# Patient Record
Sex: Male | Born: 1960 | Race: White | Hispanic: No | Marital: Married | Smoking: Former smoker
Health system: Southern US, Community
[De-identification: ages and names within clinical notes are randomized; demographics above are authoritative.]

## PROBLEM LIST (undated history)

## (undated) DIAGNOSIS — Z8739 Personal history of other diseases of the musculoskeletal system and connective tissue: Secondary | ICD-10-CM

## (undated) DIAGNOSIS — I495 Sick sinus syndrome: Secondary | ICD-10-CM

## (undated) DIAGNOSIS — M1990 Unspecified osteoarthritis, unspecified site: Secondary | ICD-10-CM

## (undated) DIAGNOSIS — Z950 Presence of cardiac pacemaker: Secondary | ICD-10-CM

## (undated) DIAGNOSIS — R112 Nausea with vomiting, unspecified: Secondary | ICD-10-CM

## (undated) DIAGNOSIS — K219 Gastro-esophageal reflux disease without esophagitis: Secondary | ICD-10-CM

## (undated) DIAGNOSIS — I209 Angina pectoris, unspecified: Secondary | ICD-10-CM

## (undated) DIAGNOSIS — R55 Syncope and collapse: Secondary | ICD-10-CM

## (undated) DIAGNOSIS — I10 Essential (primary) hypertension: Secondary | ICD-10-CM

## (undated) DIAGNOSIS — R519 Headache, unspecified: Secondary | ICD-10-CM

## (undated) DIAGNOSIS — R531 Weakness: Secondary | ICD-10-CM

## (undated) DIAGNOSIS — R0600 Dyspnea, unspecified: Secondary | ICD-10-CM

## (undated) DIAGNOSIS — Z8719 Personal history of other diseases of the digestive system: Secondary | ICD-10-CM

## (undated) DIAGNOSIS — F32A Depression, unspecified: Secondary | ICD-10-CM

## (undated) DIAGNOSIS — J449 Chronic obstructive pulmonary disease, unspecified: Secondary | ICD-10-CM

## (undated) DIAGNOSIS — M542 Cervicalgia: Secondary | ICD-10-CM

## (undated) DIAGNOSIS — R51 Headache: Secondary | ICD-10-CM

## (undated) DIAGNOSIS — Z9889 Other specified postprocedural states: Secondary | ICD-10-CM

## (undated) DIAGNOSIS — R0602 Shortness of breath: Secondary | ICD-10-CM

## (undated) DIAGNOSIS — Z8619 Personal history of other infectious and parasitic diseases: Secondary | ICD-10-CM

## (undated) DIAGNOSIS — F329 Major depressive disorder, single episode, unspecified: Secondary | ICD-10-CM

## (undated) DIAGNOSIS — R351 Nocturia: Secondary | ICD-10-CM

## (undated) HISTORY — PX: INSERT / REPLACE / REMOVE PACEMAKER: SUR710

## (undated) HISTORY — PX: CARDIAC ELECTROPHYSIOLOGY STUDY & DFT: SHX1293

## (undated) HISTORY — DX: Syncope and collapse: R55

## (undated) HISTORY — PX: BACK SURGERY: SHX140

## (undated) HISTORY — DX: Sick sinus syndrome: I49.5

## (undated) HISTORY — PX: HERNIA REPAIR: SHX51

## (undated) HISTORY — PX: CARDIAC CATHETERIZATION: SHX172

## (undated) HISTORY — PX: SHOULDER ARTHROSCOPY W/ ROTATOR CUFF REPAIR: SHX2400

## (undated) HISTORY — PX: UMBILICAL HERNIA REPAIR: SHX196

## (undated) HISTORY — PX: CARDIAC ELECTROPHYSIOLOGY MAPPING AND ABLATION: SHX1292

---

## 1999-07-21 HISTORY — PX: LOOP RECORDER INSERTION: SHX6722

## 1999-08-06 ENCOUNTER — Inpatient Hospital Stay (HOSPITAL_COMMUNITY): Admission: EM | Admit: 1999-08-06 | Discharge: 1999-08-11 | Payer: Self-pay | Admitting: Emergency Medicine

## 1999-08-06 ENCOUNTER — Encounter: Payer: Self-pay | Admitting: Emergency Medicine

## 1999-08-08 ENCOUNTER — Encounter: Payer: Self-pay | Admitting: Internal Medicine

## 1999-08-11 ENCOUNTER — Encounter: Payer: Self-pay | Admitting: Internal Medicine

## 2000-07-12 ENCOUNTER — Ambulatory Visit (HOSPITAL_COMMUNITY): Admission: RE | Admit: 2000-07-12 | Discharge: 2000-07-12 | Payer: Self-pay | Admitting: Gastroenterology

## 2001-03-28 ENCOUNTER — Encounter: Payer: Self-pay | Admitting: Internal Medicine

## 2001-05-05 ENCOUNTER — Emergency Department (HOSPITAL_COMMUNITY): Admission: EM | Admit: 2001-05-05 | Discharge: 2001-05-05 | Payer: Self-pay | Admitting: *Deleted

## 2001-05-07 ENCOUNTER — Encounter: Payer: Self-pay | Admitting: *Deleted

## 2001-05-07 ENCOUNTER — Emergency Department (HOSPITAL_COMMUNITY): Admission: EM | Admit: 2001-05-07 | Discharge: 2001-05-07 | Payer: Self-pay

## 2001-07-15 ENCOUNTER — Encounter: Payer: Self-pay | Admitting: Internal Medicine

## 2001-07-20 HISTORY — PX: LOOP RECORDER REMOVAL: SHX6723

## 2001-07-25 ENCOUNTER — Encounter: Payer: Self-pay | Admitting: Internal Medicine

## 2001-07-25 ENCOUNTER — Ambulatory Visit (HOSPITAL_COMMUNITY): Admission: RE | Admit: 2001-07-25 | Discharge: 2001-07-25 | Payer: Self-pay | Admitting: Internal Medicine

## 2003-08-02 ENCOUNTER — Encounter: Payer: Self-pay | Admitting: Cardiology

## 2003-08-02 ENCOUNTER — Inpatient Hospital Stay (HOSPITAL_COMMUNITY): Admission: EM | Admit: 2003-08-02 | Discharge: 2003-08-06 | Payer: Self-pay | Admitting: Emergency Medicine

## 2003-08-16 ENCOUNTER — Encounter: Admission: RE | Admit: 2003-08-16 | Discharge: 2003-08-16 | Payer: Self-pay | Admitting: Family Medicine

## 2005-06-08 ENCOUNTER — Ambulatory Visit: Payer: Self-pay | Admitting: Cardiology

## 2005-06-08 ENCOUNTER — Emergency Department (HOSPITAL_COMMUNITY): Admission: EM | Admit: 2005-06-08 | Discharge: 2005-06-08 | Payer: Self-pay | Admitting: Emergency Medicine

## 2007-11-17 ENCOUNTER — Encounter: Payer: Self-pay | Admitting: Cardiology

## 2008-10-02 ENCOUNTER — Encounter: Payer: Self-pay | Admitting: Cardiology

## 2008-10-03 ENCOUNTER — Encounter: Payer: Self-pay | Admitting: Cardiology

## 2008-10-05 ENCOUNTER — Encounter: Payer: Self-pay | Admitting: Cardiology

## 2008-12-14 ENCOUNTER — Encounter: Payer: Self-pay | Admitting: Cardiology

## 2008-12-17 ENCOUNTER — Encounter: Payer: Self-pay | Admitting: Cardiology

## 2009-01-11 ENCOUNTER — Encounter: Payer: Self-pay | Admitting: Cardiology

## 2009-01-12 ENCOUNTER — Encounter (INDEPENDENT_AMBULATORY_CARE_PROVIDER_SITE_OTHER): Payer: Self-pay | Admitting: *Deleted

## 2009-01-12 ENCOUNTER — Ambulatory Visit: Payer: Self-pay | Admitting: Cardiology

## 2009-01-12 DIAGNOSIS — R55 Syncope and collapse: Secondary | ICD-10-CM | POA: Insufficient documentation

## 2009-01-19 ENCOUNTER — Telehealth: Payer: Self-pay | Admitting: Cardiology

## 2009-01-20 ENCOUNTER — Ambulatory Visit: Payer: Self-pay | Admitting: Internal Medicine

## 2009-01-20 DIAGNOSIS — R079 Chest pain, unspecified: Secondary | ICD-10-CM | POA: Insufficient documentation

## 2009-01-28 ENCOUNTER — Ambulatory Visit: Payer: Self-pay | Admitting: Cardiology

## 2009-01-28 ENCOUNTER — Encounter: Payer: Self-pay | Admitting: Internal Medicine

## 2009-03-07 ENCOUNTER — Ambulatory Visit: Payer: Self-pay | Admitting: Cardiology

## 2009-03-30 ENCOUNTER — Encounter: Payer: Self-pay | Admitting: Cardiology

## 2009-04-06 ENCOUNTER — Encounter (INDEPENDENT_AMBULATORY_CARE_PROVIDER_SITE_OTHER): Payer: Self-pay | Admitting: *Deleted

## 2009-04-06 ENCOUNTER — Ambulatory Visit: Payer: Self-pay | Admitting: Cardiology

## 2009-04-06 DIAGNOSIS — R9430 Abnormal result of cardiovascular function study, unspecified: Secondary | ICD-10-CM | POA: Insufficient documentation

## 2009-05-20 ENCOUNTER — Encounter (INDEPENDENT_AMBULATORY_CARE_PROVIDER_SITE_OTHER): Payer: Self-pay | Admitting: *Deleted

## 2009-05-26 ENCOUNTER — Encounter: Payer: Self-pay | Admitting: Cardiology

## 2009-08-05 ENCOUNTER — Emergency Department (HOSPITAL_COMMUNITY): Admission: EM | Admit: 2009-08-05 | Discharge: 2009-08-05 | Payer: Self-pay | Admitting: Emergency Medicine

## 2010-03-23 NOTE — Letter (Signed)
Summary: Generic Engineer, agricultural at [facility] [address]   [place], [place] [address]   Phone: [phone]  Fax: [phone]        May 20, 2009 MRN: [id number]    [facility] [address] [place], [place]  [address]    Dear Mr. MARRO,   We ordered lab work to check your thyroid hormone level following your January 17th office visit. We also mailed you a letter on February 16th reminding you to have this lab work done. However, it does not appear this lab work has been done. We do have recent labs from your primary MD, but the thyroid level was not a part of this lab work.  Please, take the enclosed order to the [facility] or you primary MD at your earliest convenience to have this lab work done. If you go to your primary MD, please make sure they fax this report to [place] at [phone]. If you do not plan to have the lab work completed, please notify me so that we can properly document this in your chart.       Sincerely,  Cyril Loosen, RN, BSN  This letter has been electronically signed by your physician.  Appended Document: Generic Letter This letter will be mailed certified to pt.

## 2010-03-23 NOTE — Assessment & Plan Note (Signed)
Summary: 6 WK FU PER [facility] IN [place]   Visit Type:  Follow-up Referring Provider:  Dalsimer Primary Provider:  None  CC:  follow-up visit.  History of Present Illness: the patient is a 50 year old male with a long-standing history of syncope. In 2003 he underwent tilt table testing. His symptoms are consistent with vasovagal syncope. He reports nausea dizziness tunnelvision and severe diaphoresis prior to an episode. He was recently seen by Dr. Ladona Ridgel who started him on Florinef 0.1 mg p.o. b.i.d. the patient has had no syncope in the interim, although he states on one occasion he felt presyncopal. The patient was not aware of cross counter maneuvers. I discussed this with him today. He denies any central chest pain, shortness of breath orthopnea or PND. The patient will need  shoulder surgery, and was previously cleared by Dr. Ladona Ridgel. He also underwent a Cardiolite study which showed normal ejection fraction and no definite ischemia, and there was a small defect but this was burred only fixed. 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 He states it is quiteheat intolerant,and is very diaphoretic when exposed to extreme temperatures.  Clinical Review Panels:  CXR CXR results            PORTABLE CHEST - 1 VIEW 10/02/2008 1637 hours:                             Comparison: Portable chest x-ray 11/17/2007.                   Findings: Heart size normal and mediastinal contours unremarkable         for the AP portable technique.  Pulmonary parenchyma clear.         Bronchovascular markings normal.  Pulmonary vascularity normal.  No         pneumothorax.  No visible pleural effusions.                   IMPRESSION:         No acute cardiopulmonary disease.                              ** REPORT SIGNED IN OTHER VENDOR SYSTEM  **                        Reported By: Arnell Sieving, MD            (10/02/2008)    Preventive Screening-Counseling & Management  Alcohol-Tobacco     Smoking Status: never  Comments: Smoked marijuana in the past  Current Medications (verified): 1)  Fludrocortisone Acetate 0.1 Mg Tabs (Fludrocortisone Acetate) .... One By Mouth Two Times A Day At 7:00am and 2:00pm  Allergies: No Known Drug Allergies  Comments:  Nurse/Medical Assistant: The patient's medications were reviewed with the patient and were updated in the Medication List. Pt brought medication bottle to office visit. Cyril Loosen, RN, BSN (March 07, 2009 2:19 PM)  Past History:  Past Medical History: Last updated: 01/12/2009 Tachybrady syndrome syncopal episode hernia repair Substance abuse, marijuana  Past Surgical History: Last updated: 01/11/2009 1. Electrophysiologic study. 2. Implantation of an implantable cardiac loop. 3. Hernia repair  Head up tilt table testing. August 04, 2003  Family History: Last updated: 01/12/2009 Negative FH of Diabetes, Hypertension,  or Coronary Artery Disease  Social History: Last updated: 01/11/2009 Occasional marijuana according to ER notes [facility] Occasional alcohol No tobacco use Full Time Married   Risk Factors: Smoking Status: never (03/07/2009)  Review of Systems       The patient complains of fainting and dizziness.    Vital Signs:  Patient profile:   50 year old male Height:      69 inches Weight:      212.50 pounds Pulse rate:   58 / minute BP sitting:   126 / 80  (left arm) Cuff size:   regular  Vitals Entered By: Cyril Loosen, RN, BSN (March 07, 2009 2:17 PM) CC: follow-up visit   Physical Exam  Additional Exam:  General: Well-developed, well-nourished in no distress head: Normocephalic and atraumatic eyes PERRLA/EOMI intact, conjunctiva and lids normal nose: No deformity or lesions mouth normal dentition, normal posterior pharynx neck: Supple, no JVD.  No masses, thyromegaly or abnormal cervical  nodes. No carotid bruits lungs: Normal breath sounds bilaterally without wheezing.  Normal percussion heart: regular rate and rhythm with normal S1 and S2, no S3 or S4.  PMI is normal.  No pathological murmurs abdomen: Normal bowel sounds, abdomen is soft and nontender without masses, organomegaly or hernias noted.  No hepatosplenomegaly musculoskeletal: Back normal, normal gait muscle strength and tone normal pulsus: Pulse is normal in all 4 extremities Extremities: No peripheral pitting edema neurologic: Alert and oriented x 3 skin: Intact without lesions or rashes cervical nodes: No significant adenopathy psychologic: Normal affect    Impression & Recommendations:  Problem # 1:  VASOVAGAL SYNCOPE (ICD-780.2) the patient is on low-dose propranolol as well as Florinef. Strongly recommended that he drinks Gatorade G2 at least 3-4 20 ounce bottles a day. I also discussed with him to use cross counter maneuvers, to either abort or delay a vasovagal episode. In particularly I told him to cross his legsr and tensing up his legs, abdominal muscles and buttocks. In the future Florinef can be increased or we could consider adding Midrin to his medical regimen.we will check a TSH to make sure the patient does not have a cold thyroid disease explaining his heat intolerance. The following medications were removed from the medication list:    Propranolol Hcl 20 Mg Tabs (Propranolol hcl) .Marland Kitchen... Take one tablet by mouth two times a day  Orders: T-TSH ([id number])  Problem # 2:  CHEST PAIN UNSPECIFIED (ICD-786.50) the patient has atypical chest pain. There is no definite evidence of significant coronary artery disease. He has a low risk Cardiolite study and should be able to undergo surgery without any significant cardiovascular contraindications. The following medications were removed from the medication list:    Propranolol Hcl 20 Mg Tabs (Propranolol hcl) .Marland Kitchen... Take one tablet by mouth two times a  day  Problem # 3:  PREOPERATIVE EXAMINATION (ICD-V72.84) as outlined above the patient has been cleared from a cardiovascular perspective to proceed with surgery.  Patient Instructions: 1)  Labs:  TSH  2)  G2 - 24 oz bottles, 4 bottles per day 3)  Surgical clearance okay.   4)  Follow up 2/16 at 1:00.

## 2010-03-23 NOTE — Assessment & Plan Note (Signed)
Summary: 1 mo fu -srs   Visit Type:  Follow-up Referring Provider:  Dalsimer Primary Provider:  None  CC:  follow-up visit.  History of Present Illness: the patient is a 50 year old male with a history of vasovagal syncope. The patient underwent recent rotator cuff surgery. He has been doing well. He reports no cardiovascular complications. The patient passed an extensive workup for syncope. I placed facial fludrocortisone and encourage his fluid intake. He has had no further episodes of syncope. The patient states he is much improved. Prior to surgery had a stress test done which showed a small scar at the base of the inferior wall with peri-infarct ischemia. However the patient has no chest pain and his ejection fraction 55%. He denies any PND or shortness of breath on exertion  Clinical Review Panels:  CXR CXR results            PORTABLE CHEST - 1 VIEW 10/02/2008 1637 hours:                             Comparison: Portable chest x-ray 11/17/2007.                   Findings: Heart size normal and mediastinal contours unremarkable         for the AP portable technique.  Pulmonary parenchyma clear.         Bronchovascular markings normal.  Pulmonary vascularity normal.  No         pneumothorax.  No visible pleural effusions.                   IMPRESSION:         No acute cardiopulmonary disease.                              ** REPORT SIGNED IN OTHER VENDOR SYSTEM  **                        Reported By: Arnell Sieving, MD           (10/02/2008)  Echocardiogram Echocardiogram  Transthoracic Echocardiogram   ---------------------------------------------------------------   SUMMARY   -  Overall left ventricular systolic function was normal. Left         ventricular ejection fraction was estimated , range being 55         % to 65 %.. This study was inadequate for the evaluation of         left ventricular regional wall motion.   IMPRESSIONS   -  Extremely limited due to poor  sound wave transmission; LV         function appears to be preserved; focal wall motion         abnormality cannot be excluded; suggest TEE or MUGA if         clinically indicated.    Olga Millers M.D.    (08/02/2003)  Nuclear Stress Testing Nuclear Stress Test Findings mild decrease in uptake at the base of her wall. Mild reversibility. Mild hypokinesis at the basal inferior wall. The spinous are consistent mild scar at the base of the inferior wall with mild peri-infarct ischemia. Ejection fraction 55%. (01/28/2009)    Preventive Screening-Counseling & Management  Alcohol-Tobacco     Smoking Status: quit     Year Quit: 1990  Current Problems (verified): 1)  Vasovagal  Syncope  (ICD-780.2) 2)  Preoperative Examination  (ICD-V72.84) 3)  Chest Pain Unspecified  (ICD-786.50) 4)  Syncope  (ICD-780.2)  Current Medications (verified): 1)  Fludrocortisone Acetate 0.1 Mg Tabs (Fludrocortisone Acetate) .... One By Mouth Two Times A Day At 7:00am and 2:00pm  Allergies (verified): No Known Drug Allergies  Comments:  Nurse/Medical Assistant: The patient's medications and allergies were reviewed with the patient and were updated in the Medication and Allergy Lists. List brought.  Past History:  Past Medical History: Last updated: 04/06/2009 Tachybrady syndrome Vasovagal syncope syncopal episode hernia repair Substance abuse, marijuana  Past Surgical History: Last updated: 01/11/2009 1. Electrophysiologic study. 2. Implantation of an implantable cardiac loop. 3. Hernia repair  Head up tilt table testing. August 04, 2003  Family History: Last updated: 01/12/2009 Negative FH of Diabetes, Hypertension, or Coronary Artery Disease  Social History: Last updated: 01/11/2009 Occasional marijuana according to ER notes [facility] Occasional alcohol No tobacco use Full Time Married   Risk Factors: Smoking Status: quit (04/06/2009)  Social History: Smoking Status:  quit  Review  of Systems  The patient denies fatigue, malaise, fever, weight gain/loss, vision loss, decreased hearing, hoarseness, chest pain, palpitations, shortness of breath, prolonged cough, wheezing, sleep apnea, coughing up blood, abdominal pain, blood in stool, nausea, vomiting, diarrhea, heartburn, incontinence, blood in urine, muscle weakness, joint pain, leg swelling, rash, skin lesions, headache, fainting, dizziness, depression, anxiety, enlarged lymph nodes, easy bruising or bleeding, and environmental allergies.    Vital Signs:  Patient profile:   50 year old male Height:      69 inches Weight:      211 pounds Pulse rate:   80 / minute BP sitting:   130 / 80  (left arm) Cuff size:   large  Vitals Entered By: Carlye Grippe (April 06, 2009 12:54 PM) CC: follow-up visit   Physical Exam  Additional Exam:  General: Well-developed, well-nourished in no distress head: Normocephalic and atraumatic eyes PERRLA/EOMI intact, conjunctiva and lids normal nose: No deformity or lesions mouth normal dentition, normal posterior pharynx neck: Supple, no JVD.  No masses, thyromegaly or abnormal cervical nodes lungs: Normal breath sounds bilaterally without wheezing.  Normal percussion heart: regular rate and rhythm with normal S1 and S2, no S3 or S4.  PMI is normal.  No pathological murmurs abdomen: Normal bowel sounds, abdomen is soft and nontender without masses, organomegaly or hernias noted.  No hepatosplenomegaly musculoskeletal: Back normal, normal gait muscle strength and tone normal pulsus: Pulse is normal in all 4 extremities Extremities: No peripheral pitting edema neurologic: Alert and oriented x 3 skin: Intact without lesions or rashes cervical nodes: No significant adenopathy psychologic: Normal affect    Impression & Recommendations:  Problem # 1:  SYNCOPE (ICD-780.2) the patient has a. history of vasovagal syncope. He has much improved with fludrocortisone and increased salt  intake.  Problem # 2:  NONSPECIFIC ABNORMAL UNSPEC CV FUNCTION STUDY (ICD-794.30) the patient has a low risk Cardiolite study. Given the absence of symptoms I do not think a further workup recatheterization is required.I recommended for the patient take an aspirin a day. He also will need blood work to follow his cholesterol levels which can be done by his primary care physician.  Patient Instructions: 1)  Your physician recommends that you continue on your current medications as directed. Please refer to the Current Medication list given to you today. 2)  Your physician wants you to follow-up in: 6 months. You will receive a reminder letter in the  mail about two months in advance. If you don't receive a letter, please call our office to schedule the follow-up appointment.

## 2010-03-23 NOTE — Letter (Signed)
Summary: Certified Letter Response Card  Certified Letter Response Card   Imported By: Cyril Loosen, RN, BSN 06/06/2009 13:24:02  _____________________________________________________________________  External Attachment:    Type:   Image     Comment:   External Document

## 2010-03-23 NOTE — Letter (Signed)
Summary: Generic Engineer, agricultural at [facility] [address]   [place], [place] [address]   Phone: [phone]  Fax: [phone]        April 06, 2009 MRN: [id number]    [facility] [address] [place], [place]  [address]    Dear [name] [name],  We ordered lab work to check your thyroid hormone level following your January 17th office visit. It does not appear this lab work has been done. We do have recent labs from your primary MD but the thyroid level was not a part of this lab work.  Please, take the enclosed order to the [facility] or you primary MD at your earliest convenience to have this lab work done. If you go to your primary MD, please make sure they fax this report to [place] at [phone].     Sincerely,  Cyril Loosen, RN, BSN  This letter has been electronically signed by your physician.

## 2010-05-07 LAB — COMPREHENSIVE METABOLIC PANEL
Albumin: 4.1 g/dL (ref 3.5–5.2)
Alkaline Phosphatase: 63 U/L (ref 39–117)
BUN: 13 mg/dL (ref 6–23)
CO2: 22 mEq/L (ref 19–32)
Calcium: 9.4 mg/dL (ref 8.4–10.5)
Creatinine, Ser: 1.09 mg/dL (ref 0.4–1.5)
GFR calc non Af Amer: >60 mL/min (ref >60)
Potassium: 3.5 mEq/L (ref 3.5–5.1)
Sodium: 140 mEq/L (ref 135–145)
Total Bilirubin: 0.5 mg/dL (ref 0.3–1.2)

## 2010-05-07 LAB — POCT I-STAT, CHEM 8
Chloride: 108 mEq/L (ref 96–112)
Hemoglobin: 14.6 g/dL (ref 13.0–17.0)
Potassium: 3.5 mEq/L (ref 3.5–5.1)
Sodium: 139 mEq/L (ref 135–145)
TCO2: 22 mmol/L (ref 0–100)

## 2010-05-07 LAB — DIFFERENTIAL
Basophils Relative: 1 % (ref 0–1)
Lymphocytes Relative: 32 % (ref 12–46)
Monocytes Absolute: 0.7 10*3/uL (ref 0.1–1.0)
Monocytes Relative: 7 % (ref 3–12)

## 2010-05-07 LAB — POCT CARDIAC MARKERS
Myoglobin, poc: 86.9 ng/mL (ref 12–200)
Troponin i, poc: <0.05 ng/mL (ref 0.00–0.09)

## 2010-05-07 LAB — CBC
HCT: 42.7 % (ref 39.0–52.0)
Hemoglobin: 14.8 g/dL (ref 13.0–17.0)
MCHC: 34.6 g/dL (ref 30.0–36.0)
Platelets: 234 10*3/uL (ref 150–400)
WBC: 9.8 10*3/uL (ref 4.0–10.5)

## 2010-07-07 NOTE — Consult Note (Signed)
NAMEDWYNE, HASEGAWA                           ACCOUNT NO.:  [id number]   MEDICAL RECORD NO.:  [id number]                   PATIENT TYPE:  INP   LOCATION:  2016                                 FACILITY:  [facility]   PHYSICIAN:  Duke Salvia, M.D.               DATE OF BIRTH:  [birth year]   DATE OF CONSULTATION:  DATE OF DISCHARGE:                                   CONSULTATION   Thank you very much for asking me to see [name] [name] in consultation for  recurrent syncope.   Mr. Earwood is a 50 year old gentleman who passed out the other day.  He  awakened in bed with a sensation of head pressure, diaphoresis, heart  beating slowly but hard.  He was somewhat nauseated.  He stood up.  His  symptoms worsened.  He walked 20 to 30 feet across the room, felt the need  to throw up, sat down because of progressive symptoms. He tried to get up  and get to the trash can to throw up, and then passed out.  Apparently he  was brought in by EMS, but there is no EMS note that I can find in the  chart.   After he had been hospitalization, there were two episodes of severe  headaches at the top of the head that lasted about 10 minutes and resolved.  They are associated with some bradycardia and emesis.  They were  unassociated with significant hypotension.  Indeed, there was some moderate  hypertension, and no significant bradycardia.   Apparently, after one of these episodes, he was put in a wheelchair, in a  transport monitor, taken down to CT scan.  The patient recalls little of  this.  There is a second hand nursing report that his heart rate was fast in  the 200 beats per minute range, but there is no documentation of this.   The patient's prior syncopal history is outlined in a consultation noted  from 2001, and suffice it to say has been notable for abrupt episodes with  little warning that had put him at risk, i.e. like falling into a lake.  In  the intervening four years, the first two of  which he had an implantable  loop recorder, he apparently had 30 to 40 episodes that were light.  He  has had no frank syncope as best he can recall.  He did not activate his  loop recorder in any of the presyncopal spells.  These spells have occurred  while lying, while sitting, while standing.  The patient does have an  antecedent history of GE reflux disease.   His previous evaluation included stress testing as well as a flecainide  challenge because of the concern about the Brugada with an ST elevation in  the anterior precordium.  This was negative.   PAST MEDICAL HISTORY:  Notable for arthritis, GE reflux disease.   MEDICATIONS:  1. Protonix.  2. Voltaren.  3. Aspirin (?).   SOCIAL HISTORY:  He is married.  He has children.  He works in a sawmill.  He does use marijuana.  This was detected in his urine.   FAMILY HISTORY:  Noncontributory.  There is a concern about a maternal  cousin who died at a young age, and hence, the workup for Brugada.   REVIEW OF SYSTEMS:  Otherwise nonrevealing.   PHYSICAL EXAMINATION:  GENERAL:  He is a well-developed, well-nourished,  young Caucasian male, in no acute distress.  VITAL SIGNS:  Blood pressure 115/51, pulse 58.  His orthostatics were  undertaken by Chinita Pester, see her note, and they were normal.  HEENT:  Demonstrated no xanthomas.  The neck veins were flat.  The carotids  were brisk and full bilaterally without bruits.  BACK:  Without kyphosis, scoliosis.  LUNGS:  Clear.  HEART:  Sounds were regular without murmurs or gallops.  ABDOMEN:  Soft with active bowel sounds without midline pulsation or  hepatomegaly.  PULSES:  Femoral pulses were 2+.  Distal pulses were intact.  There was no  clubbing, cyanosis or edema.  NEUROLOGIC:  Exam was grossly normal.   LABORATORY DATA:  Telemetry monitor demonstrated sinus bradycardia.  There  was also occasional PVCs.   IMPRESSION:  1. 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  2. Sinus  bradycardia.  3. Premature ventricular contractions.  4. Increased heart rate last p.m. without documentation or questions, real     versus artifact, question mechanism.  5. Right bundle branch block.  6. Previous IOR, now explanted.  7. Episodic headaches, question related to #1.  8. Marijuana use, question related to #1.   Mr. Alfieri has recurrent syncope.  It sounds neurally mediated, given its  prolonged duration.  It is associated with __________ phenomenon.  The  trigger is not clear, although further review of the chart raises the issue  whether marijuana might be a potential precipitant here.   RECOMMENDATIONS:  1. We will plan to undertake tilt-table testing to see if we can reproduce     anything.  2. Refrain from marijuana exposure as it is a sympathomimetic.  3. We need to defer to the primary service about evaluation of his     headaches.   Thank you for the consultation.                                               Duke Salvia, M.D.    SCK/MEDQ  D:  08/04/2003  T:  08/04/2003  Job:  [id number]   cc:   Asencion Partridge, M.D.  Fax: [phone]   [facility]

## 2010-07-07 NOTE — Procedures (Signed)
[facility]  Patient:    [name] [name], [name] [name]                        MRN: [id number] Proc. Date: 07/12/00 Adm. Date:  [id number] Attending:  Orland Mustard                           Procedure Report  PROCEDURE:  Esophagogastroduodenoscopy.  MEDICATIONS:  Hurricaine spray, fentanyl 100 mcg, Versed 10 mg IV.  INDICATIONS:  Persistent esophageal reflux and a history of hematemesis.  INFORMED CONSENT:  The procedure had been explained to the patient and consent obtained.  DESCRIPTION OF PROCEDURE:  With the patient in the left lateral decubitus position, the Olympus adult video endoscope was inserted and advanced under direct visualization.  The distal esophagus was reached.  The distal esophagus was quite red.  There was a hiatal hernia.  The scope passed on into the stomach.  A complete gastroscopy and duodenoscopy were performed and both were completely normal with no ulceration or inflammation.  The scope was withdrawn back into the stomach and on retroflex view, the fundus and cardia were seen well.  Again, there was a hiatal hernia with a red distal esophagus with highly patent GE junction.  The scope was withdrawn, and the patient tolerated the procedure well.  ASSESSMENT:  Hiatal hernia with reflux.  PLAN:  We will continue on Aciphex, give a reflux sheet, and see back in the office in 6-8 weeks. DD:  07/12/00 TD:  07/12/00 Job: [id number]

## 2010-07-07 NOTE — Op Note (Signed)
NAMEDANZEL, MARSZALEK                           ACCOUNT NO.:  [id number]   MEDICAL RECORD NO.:  [id number]                   PATIENT TYPE:  INP   LOCATION:  2016                                 FACILITY:  [facility]   PHYSICIAN:  Duke Salvia, M.D.               DATE OF BIRTH:  09-15-1960   DATE OF PROCEDURE:  08/04/2003  DATE OF DISCHARGE:  08/06/2003                                 OPERATIVE REPORT   PROCEDURE PERFORMED:  Head up tilt table testing.   PREOPERATIVE DIAGNOSIS:  Recurrent syncope.   POSTOPERATIVE DIAGNOSIS:  Recurrent syncope.   DESCRIPTION OF PROCEDURE:  The patient was submitted to a standard tilt  table test.  After equilibration in the supine position, the patient was  elevated to 70 degrees.  There was a change in the patient's heart rate from  60s to the mid-80s to low 90s with a modest change in blood pressure from  the 120s to the 1-teens.  The patient was returned to the supine position.  Isoproterenol was started but the patient developed a very rapid heart rate  on 1 mcg per kilo going from 63 to 130 and felt tingly all over and  complained of chest pain and the study was terminated.   IMPRESSION:  1. Negative tilt table test for neurally mediated syncope.  2. Evidence of orthostatic intolerance.                                               Duke Salvia, M.D.    SCK/MEDQ  D:  08/26/2003  T:  08/27/2003  Job:  [id number]

## 2010-07-07 NOTE — Discharge Summary (Signed)
[name] [name], [name] [name]                           ACCOUNT NO.:  [id number]   MEDICAL RECORD NO.:  [id number]                   PATIENT TYPE:  INP   LOCATION:  2016                                 FACILITY:  [facility]   PHYSICIAN:  Penni Bombard, MD                    DATE OF BIRTH:  29-Sep-1960   DATE OF ADMISSION:  DATE OF DISCHARGE:  08/06/2003                                 DISCHARGE SUMMARY   PRIMARY CARE PHYSICIAN:  [facility].   REFERRING PHYSICIAN:  None.   CONSULTING PHYSICIAN:  Dr. Duke Salvia with [facility].   FINAL DIAGNOSES:  1. Syncope secondary to postural tachycardia.  2. Post herpetic neuralgias.  3. Marijuana use.  4. Right bundle branch block.   PRINCIPAL PROCEDURES:  1. Multiple CT's of the head which showed no apparent disease.  2. Tilt-table testing which was positive for postural tachycardia.  3. A 2-D echocardiogram showed ejection fraction of 55-65%, and study was     inadequate for evaluation of left ventricular regional wall motion.     Otherwise, study was within normal limits.   ADMISSION HISTORY AND PHYSICAL:  Please see chart for full details.  However, in short, [name] [name] is a 50 year old gentleman who on the day of  admission was sleeping when he woke up and felt lightheaded and dizzy.  He  stood up and walked toward the bathroom when he broke out in a cold sweat.  He had an episode of emesis.  He then passed out flat on the floor.  Per his  wife, he was out for approximately one to two minutes.  He woke up and found  himself having a nosebleed.  Prior to the syncopal episode, he felt his  heart beating very slowly and felt short of breath.  He describes having a  feeling of chest pressure and squeezing, but it was not distinctly painful.  He states he often has chest tightness. After the episodes, he described  tingling in his hands and feet, and he developed a severe headache on the  way to the hospital.  He had  no vision changes. He states he often gets  feeling lightheaded during hot weather.  During this episode, he had no loss  or urine or bowel function.  Of note, the patient was admitted in 2001 with  a similar episode and was seen by Dr. Ladona Ridgel an electrophysiologist, who was  concerned at that time that the patient might have __________syndrome.  However, further evaluation at that time was negative.   LABORATORY DATA:  Cardiac enzymes were negative. Cardiac panel was negative  x3.  Drug screen was positive for marijuana.  ESR on admission was 4.  RA  was less than 20.  Alcohol less than 5.  Magnesium 2.2.  Phosphorus 3.1.  Sodium 142, potassium 3.7, chloride 108, CO2 26, glucose 103, BUN  12,  creatinine 1.1.  Calcium 9.2.  TSH 1.964.  T-bili _________.  Alkaline  phosphatase 50, AST 19, ALT 14.  Protein 5.9, albumin 3.5.   HOSPITAL COURSE:  1. Syncope.  The patient was admitted to telemetry, and the patient had     multiple episodes of documented bradycardia into the 30's, lasting     several minutes.  The patient had on day 2 of admission, an episode of     bradycardia into the 30's at which time he developed a severe headache on     the top of his head.  It was throbbing and associated with photophobia.     He had one episode of nausea. He also developed a nosebleed and     diaphoresis with this.  He also developed hypertension.  The headache was     short lived and was relieved prior to the admission of Dilaudid.  The     patient went down for a head CT.  Per report, there is no documentation     of this as the patient was on portable telemetry at the time.  Per     report, on the way to head CT, the patient became tachycardic to the     220's, became very diaphoretic, and had a near syncopal episode last     lasted approximately two minutes.  The patient has had several episodes     of documented bradycardia throughout the remainder of his hospital stay.     However, several of  these episodes of bradycardia were not associated     with syncopal or near syncopal episodes.  The patient was evaluated by     cardiology and electrophysiology who thought that his bradycardia was not     related to his syncopal episodes.  He was sent for tilt-table testing     which showed positive postural tachycardia.  They felt that he needed     salt and water replacement and recommended that the patient consume three     high in concentration of salt.  They also felt the patient's marijuana     use was contributing to his possible episode.  They recommended that he     cease all use of marijuana.  They recommended followup with Dr. Ladona Ridgel as     needed.  They recommended that if he has further symptoms or increasing     symptoms, that he could possibly be tried on a low-dose beta-blocker.     This was not started during this admission as the patient's pulse was     consistently in the 50's.  The patient had no further episodes of     headache with presyncopal/ syncopal episodes throughout hospitalization     other than that one episode on day #2 of admission.  2. Post-herpetic neuralgia.  On day #2 of admission, the patient complained     of back pain starting at near his spine and radiating across the left     aspect of his back around near his costovertebral angle.  He states it     was burning in nature, and it was very similar to an outbreak of shingles     he had had in the past.  Upon examination, the patient had an area of     erythema with multiple small non-vesicular lesions.  He was started on     Neurontin 100 mg p.o. t.i.d. as he had scratched at these lesions and  opened them, and therefore we thought Capzasin cream would cause burning.     The patient tolerated the Neurontin well, and the Neurontin caused a     burning sensation from this lesion to subside.  The patient was given a    one-month supply of the Neurontin to help with this pain until this pain      subsided.  3. Arthritis.  The patient complained of arthritic type pain in his     shoulders, hands and knees.  He states that his mother and brother both     have crippling arthritis. A rheumatoid factor and ESR were drawn on     admission.  Both were negative.  No further workup was done at this time.     The patient was treated with ibuprofen 800 mg p.o. q.8h. p.r.n. with no     further complaints throughout hospitalization. Further workup may be     indicated as an outpatient.   INSTRUCTIONS TO PATIENT AND FAMILY:  1. He could eat a diet high in salt and water, primarily to be in the form     of salty water drinks.  2. The patient is to follow up with Dr. Ladona Ridgel as needed.  3. The patient is to follow up with [facility] on     August 13, 2003, at 2:45 p.m.   DISCHARGE MEDICATIONS:  Neurontin 100 mg p.o. t.i.d. until pain in back is  alleviated.                                                Penni Bombard, MD    SJ/MEDQ  D:  08/06/2003  T:  08/08/2003  Job:  [id number]   cc:   [facility]

## 2010-07-07 NOTE — H&P (Signed)
[name] [name], [name] [name]                           ACCOUNT NO.:  [id number]   MEDICAL RECORD NO.:  [id number]                   PATIENT TYPE:  INP   LOCATION:  2016                                 FACILITY:  [facility]   PHYSICIAN:  Penni Bombard, MD                    DATE OF BIRTH:  [birth year]   DATE OF ADMISSION:  08/02/2003  DATE OF DISCHARGE:                                HISTORY & PHYSICAL   CHIEF COMPLAINT:  Syncope.   PRIMARY CARE PHYSICIAN:  Dr. Shelva Majestic in [place] and Dr. Charlynne Pander   ATTENDING PHYSICIAN:  Asencion Partridge, M.D.   REFERRING PHYSICIAN:  None.   HISTORY OF PRESENT ILLNESS:  Mr. Eisenhour was sleeping when he woke up and felt  lightheaded and a dizzy feeling. He stood up and walked towards the bathroom  when he broke out in the cold sweat and had an episode of emesis. He passed  out flat on the floor. Per his wife, he was out for approximately one to two  minutes. He woke up and found himself having a nosebleed. Prior to his  syncopal episode he felt his heart beat very slowly and felt short of  breath.  He describes having a feeling of chest pressure and squeezing, but  not distinctly pain. He states he often has chest tightness. After the  episode he describes tingling in the hands and feet, and he developed a  severe headache on the way to the hospital. He had no vision changes. He  states he often gets feelings of lightheadedness during hot weather. During  this episode he had no loss of urine or bowel function. Of note, the patient  was admitted in 2001 with a similar episode and was seen by Dr. 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   PAST MEDICAL HISTORY:  1. History of syncopal episodes in the past with evaluation by EP.  2. Flecainide challenge in 2001 was negative. Echocardiogram in 2001     revealed LVEF within normal limits, trace mitral valve  regurgitation.  3. Arthritis.  4. Nosebleeds.  5. Hiatal hernia.   SOCIAL HISTORY:  No tobacco. Positive marijuana use. No street drugs.  Occasional beer. Lives in [place]. Lives with his wife and daughter. Works  at a sawmill.   FAMILY HISTORY:  No MI. No stroke. Uncle with history of sudden cardiac  death under the age of 71. No cancer. His mother had diabetes and his mother  and brother have severe debilitating arthritis.   CURRENT MEDICATIONS:  None. No herbs and no vitamins.   ALLERGIES:  No known drug allergies.   REVIEW OF SYSTEMS:  No fever, chills, or nightsweats. No vision changes. No  taste or hearing changes. No dysphagia. No shortness of breath. No dyspnea  on exertion. No  orthopnea, PND. Positive occasional chest pressure. No gross  symptoms. Positive pain secondary to his hiatal hernia. No constipation,  diarrhea, dysuria, hesitancy, no MSK pain. No cramps. Positive joint pains  mostly proximal in the shoulder, hands, and knees. No bruising. Questionable  submandibular lymphadenopathy and positive heat intolerance.   PHYSICAL EXAMINATION:  VITAL SIGNS: Temperature 97.0, pulse 50 to 11,  respirations 28, blood pressure 165/94. Next BP 100/40, O2 100 on two  liters, then 98 on room air.  GENERAL: In no apparent distress.  CV: Bradycardia. No murmurs, rubs, or gallops. No displaced PMI. 2+ pulses.  No JVD.  LUNGS: Clear to auscultation bilaterally. No wheezes, crackles, or rales.  HEENT: Eyes are PERRLA. EOMI. On funduscopic exam the discs are clear and  sharp bilaterally. The throat is nonerythematous with no exudates. There are  large submandibular lymph nodes, right greater than left.  ABDOMEN: Positive bowel sounds, nontender, nondistended, soft with no  hepatosplenomegaly. No masses.  EXTREMITIES: No edema. 2+ pulses bilaterally.  MSK: 5/5 strength bilaterally.  NEUROLOGIC: Cranial nerves II-XII grossly intact. Alert and oriented times  three. Gait was not  assessed. Mood was congruent.   LABORATORY DATA:  WBC 10.8, hemoglobin 15.1, hematocrit 43.4, platelet count  214,000, MCV 91.4, ANC 6.7, PTT 27. Sodium 139, potassium 3.3, chloride 108,  BUN 15, glucose 111, myoglobin 62.7, CK-MB 1.0, troponin-I less than 0.05.   PROCEDURE:  CT of the head revealed no intracranial abnormality, chronic  sinus disease.   ASSESSMENT/PLAN:  This is a 50 year old white male, status post syncopal  episode.  1. Syncope. The differential for the patient's syncope includes arrhythmia,     bradycardia, vasovagal reaction, medications, drugs, infection, and     syncope upon others. Given the patient's past history of prior cardiac     syncope and a heart rate of 38 upon arrival to the ER, I favor arrhythmia     and bradycardia as the cause of the syncope.  The patient is currently on     no medications. Therefore, this is not likely the cause. I will check a     UDS and alcohol level to evaluate toxins and drugs as a possibility for     syncope.  In 2001 Dr. Ladona Ridgel questioned if patient possibly had regatta     syndrome as the possible cause of the syncope. He does have questionable     evidence today of EKG changes that would possibly be consistent with this     and the patient did have a cousin who died of sudden cardiac death at a     very early age. Could consider possible genetic workup such as cardiac     channel markers, however I question whether this would be of any benefit     to the patient.  The patient had been a candidate for pacemaker/ICD, but     did not follow up with Dr. Ladona Ridgel after his loop recorder had been     removed after his last hospital admission. I will place the patient on     telemetry, I will cycle cardiac enzymes to rule out an MI as cause and I     will consult EP for possible pacemaker/ICD placement. I will check a TSH,     check a fasting lipid panel, BMP, and I will start the patient on    aspirin. I will hold on starting a  beta blocker as the patient is very     low  at the current time. I will check an EKG in the morning and I will     check orthostatics. I will check an echocardiogram.  2. Arthritis. Given the patient's family history of his mother and brother     having severe debilitating arthritis I will check a rheumatoid factor and     a sed rate and start the patient on ibuprofen 600 mg p.o. q.8h. Given the     patient's physical exam of his extremities I doubt that this represents     osteoarthritis and the patient may need further evaluation.  3. Hypokalemia most likely secondary to patient's emesis. I will repeat     orally.  4. Prophylaxis. For GI, given he has a hiatal hernia and some chest pressure     I will consider starting him on a PPI to see if this decreases his     symptoms. For DVT prophylaxis I will allow the patient to ambulate.  5. Fluids, electrolytes, and nutrition.  Allow the patient to have a regular     diet.                                                Penni Bombard, MD    SJ/MEDQ  D:  08/03/2003  T:  08/03/2003  Job:  [id number]

## 2010-07-07 NOTE — Consult Note (Signed)
NAMESHARAD, VANEATON                 ACCOUNT NO.:  [id number]   MEDICAL RECORD NO.:  [id number]          PATIENT TYPE:  EMS   LOCATION:  MAJO                         FACILITY:  [facility]   PHYSICIAN:  Thomas C. Wall, M.D.   DATE OF BIRTH:  May 16, 1960   DATE OF CONSULTATION:  DATE OF DISCHARGE:                                   CONSULTATION   HISTORY OF PRESENT ILLNESS:  We were asked by the Public Health Department  to evaluate [name] [name], a 50 year old married white male well known to our  practice with a history of neurocardiogenic syncope.   He has had an extensive evaluation dating back to 2001.  Essentially, he has  had triggered syncope, several times with serious life threatening injuries,  such as falling [place], that has been extensive.  He has had a negative  tilt table, negative flecainide study for ruling out Brugada's, and no other  arrhythmias.  He had normal 2D echocardiogram in 2005.  He has had a  negative stress Cardiolite from several years ago.   He has been treated with increased fluids, salt and at one time it sounds  like Pindolol was started.  He said the Pindolol or beta blocker had no  effect on his symptomatology or his spells.   He is currently out of work and is having a hard time paying his bills.  He  is fixing to loose his house.  His wife and daughter are in the ER with him  tonight, which he has had for a number of years, but it has been worse  lately.  He cannot sleep.  He is very anxious.  He sleeps with valium or  other drugs.  He has used marijuana in the past which he has been told not  to do.   ALLERGIES:  No known drug allergies.   PAST MEDICAL HISTORY:  1.  Gastroesophageal reflux disease.  2.  Hematemesis in 2002.  3.  Nosebleed problems in the past.  4.  Arthritis.   SOCIAL HISTORY:  He lives in [place] with his wife.  He does not smoke.  He drinks a couple of beers a week.  He has done marijuana but he says he  has quit for two  months.   FAMILY HISTORY:  Noncontributory 100% on room air.  He appears very anxious.   PHYSICAL EXAMINATION:  HEENT:  Normocephalic, atraumatic.  PERRLA.  Extraocular movements intact.  Sclerae are clear.  Dentition is within  normal limits.  NECK:  Supple.  No JVD.  Carotid upstrokes are equal bilaterally without  bruits.  Thyroid is not enlarged.  LUNGS:  Clear.  HEART:  Regular rate and rhythm with normal S1, S2.  ABDOMEN:  Soft, good bowel sounds.  No distention.  No hepatosplenomegaly.  EXTREMITIES:  No cyanosis, clubbing or edema.  Pulses are intact.  NEUROLOGICAL:  Grossly intact.   STUDIES:  Electrocardiogram shows sinus rhythm with right bundle branch  block in the V1-V2.  It is stable compared to previous ECG's.   ASSESSMENT/PLAN:  1.  Neurocardiogenic syncope  over the past five years.  Beta blockers did      not decrease his episodes and has not also helped any palpitations.  2.  Sinus bradycardia of the right bundle branch block.  He has a negative      Brugada's stimulation study with flecainide.  3.  Insomnia secondary to increased stress as mentioned above and      palpitations.   RECOMMENDATIONS:  1.  Maintain good hydration and sodium which he has been told before.  2.  Tylenol P.M. p.r.n.  3.  No fishing or deer hunting alone for fear of serious injury.  4.  Avoid heavy or operating equipment or driving.      Thomas C. Wall, M.D.  Electronically Signed     TCW/MEDQ  D:  06/08/2005  T:  06/11/2005  Job:  [id number]   cc:   Doylene Canning. Ladona Ridgel, M.D.  [address]  [address]  [place]  [place] [address]   [facility]  [place], [place]

## 2010-07-07 NOTE — Consult Note (Signed)
[place]  Patient:    [name] [name], [name] [name]                        MRN: [id number] Proc. Date: 08/09/99 Adm. Date:  [id number] Attending:  Phifer, Trinna Post Dictator:   [id number] CC:         Alvester Morin, M.D.                          Consultation Report  Thank you very much for asking me to see this patient in electrophysiological consultation for recurrent syncope.  HISTORY OF PRESENT ILLNESS:  Mr. Phillips is a healthy 50 year old gentleman who has a history of recurrent syncope dating back about 10 to 15 years and multiple episodes of presyncope.  Mr. Dolinger most recent episode of syncope occurred after having driven home from fishing.  He was standing, talking next to the truck.  He got a stereotypical feeling in his head of lightheadedness.  He was able to sit down which he did for a couple of minutes  He was diaphoretic and nauseated throughout this and somewhat lightheaded.  Notwithstanding these persisting symptoms and he stood up and then abruptly fell back unconscious.  He was unconscious for five to 10 minutes before arousing slowly.  In his recovery period, he was noted to be very, very pale.  He recalls feeling hot, diaphoretic and had recovery fatigue that lasted about 10 minutes.  We do not have the vital signs available from the arrival of EMS.  The episode prior to this occurred approximately three years ago.  He was out fishing along.  He was sitting in the boat and then stood and then the next thing, he awakened and found himself under water. He was able to swim to the surface, cough and sputter.  He had some recovery fatigue.  He does not recall any other accompanying symptoms.  About 15 years ago he had another episode of syncope where, after having climbed 300 feet up a water tower with 200 pounds of rope, he climbed down and then without warning, he collapsed on the ground. He remembers in his awakening phase, being nauseated,  hot and very sweaty. He was unable to stand for some minutes thereafter and had some recovery fatigue that persisted for some time.  Notably some of these episodes are preceded by tachy palpitations.  the patient also has multiple episodes occurring once every two to three months where he has a similar sensation in his head of tingling.  This is followed by a sensation of being hot, intermittently accompanied by palpitations and then it is gone. These typically resolve within seconds and have no residual.  His syncopal episodes have all occurred while exerting himself in the heat.  These presyncopal episodes can occur without exertion occasionally while sitting. There is no relationship to meals, time of day or _________ temperature that he recalls.  He eats salt copiously.  He drinks quite a good deal of fluids and only a small portion of it is caffeinated.  He tolerates hot showers.  He does not use jaquizzis.  He does have problems with orthostatic lightheadedness.  PAST MEDICAL HISTORY:  This is otherwise noncontributory.  FAMILY HISTORY:  Notable for an uncle who died in his 30s.  He awakened and then died suddenly thereafter.  No autopsy was performed.  He had "a massive heart attack."  There is no  other syncope or seizures in the family as best as the patient and his sister can recall.  There is no other history of palpitations.  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  He does not use cigarettes, alcohol or recreational drugs.  He has a daughter and he is married.  PRESENT MEDICATIONS:  This includes only aspirin.  ALLERGIES:  NO KNOWN DRUG ALLERGIES.  REVIEW OF SYSTEMS:  Noncontributory.  PHYSICAL EXAMINATION:  VITAL SIGNS:  Blood pressure is 126/60, pulse 66.  HEENT:  No scleral icterus.  No xanthomata.  LUNGS:  Clear.  BACK:  Without kyphosis or scoliosis.  CARDIOVASCULAR:  Heart sounds are regular without  murmurs or gallops. Carotids were brisk bilaterally without bruits.  ABDOMEN:  Soft with active bowel sounds.  EXTREMITIES:  Femoral pulses were 2+.  Distal pulses were intact.  NEUROLOGICAL:  Examination was grossly normal.  ELECTROCARDIOGRAM:  August 06, 1999, demonstrates sinus rhythm at 58 beats per minute with intervals of 0.21/ 0.12/0.41 with an axis leftward at -50 degrees. There is an R prime in leads V1 and V2 with mild ST segment downward sloping. Electrocardiogram repeated on August 07, 1999, demonstrates a normal electrocardiogram.  Today the patient will receive 200 mg of flecainide and electrocardiograms obtained at one hour, two hours, and three hours demonstrate no ST segment changes suggestive of Brugada syndrome.  IMPRESSION: 1. Recurrent syncope associated with recovery epiphenomenon suggestive of an    neurally median syncope. 2. Initial electrocardiogram suggested a Brugada syndrome but flecainide    exposure at 200 mg showed no aggravation. 3. Family history of sudden death.  DISCUSSION:  Mr. Sigmund has recurrent syncope that has been life-threatening in its occurrence, though not necessarily in its mechanism.  On one occasion he almost drowned. This most recent episode had a prodrome that lasted some minutes.  It was accompanied by epiphenomenon, although typical for an early mediated syncope and he was out for some minutes afterwards.  The prolonged duration of this episode more strongly supports a diagnosis of an neurally mediated syncope.  The initially electrocardiogram is suggestive of Brugada syndrome but given the duration above, I think this is less likely.  However, I think exposure to full dose flecainide is reasonable.  My understanding of the literature is that this is done with 300 mg and not 200 mg and we will repeat it as such.  RECOMMENDATIONS:  Based on the above, therefore, I would: 1. Undertake tilt table testing. 2. Repeat flecainide  exposure at 300 mg.  3. Have recommended that the patient refrain from driving for three to six    months based on the aforementioned history.  Thank you for this consultation. DD:  08/09/99 TD:  08/09/99 Job: [id number]

## 2010-07-07 NOTE — Discharge Summary (Signed)
[name] [name], [name] [name]                           ACCOUNT NO.:  [id number]   MEDICAL RECORD NO.:  [id number]                   PATIENT TYPE:  INP   LOCATION:  2016                                 FACILITY:  [facility]   PHYSICIAN:  Zymeir Salminen Dictator                    DATE OF BIRTH:  11/09/1960   DATE OF ADMISSION:  08/02/2003  DATE OF DISCHARGE:  08/06/2003                                 DISCHARGE SUMMARY   ADDENDUM:  Please note that a few minutes prior to the patient's discharge,  I had a conversation with Dr. Graciela Husbands, the electrophysiologist, who did desire  the patient to be started on low-dose beta blocker for beta blockade for  presumed autonomic dysfunction as a possible for the patient's syncope and  postural tachycardia.  Therefore, the patient was started on Toprol XL 25 mg  p.o. daily.  The patient was warned of the side effects of medications and  was agreeable to starting this medicine.      Penni Bombard, MD                          Laden Fieldhouse Dictator    SJ/MEDQ  D:  08/06/2003  T:  08/08/2003  Job:  [id number]   cc:   [facility]

## 2010-07-07 NOTE — Discharge Summary (Signed)
[place]. [facility]  Patient:    [name] [name], [name] [name]                        MRN: [id number] Adm. Date:  [id number] Disc. Date: [id number] Attending:  Phifer, Harriett Sine Welcome Dictator:   Talmage Coin, M.D.                           Discharge Summary  DATE OF BIRTH:  1960/05/07  CONSULTING PHYSICIANS: 1. Doylene Canning. Ladona Ridgel, M.D. 2. Alt[place] , Pharm.D.  PROCEDURES: 1. Electrophysiologic study. 2. Implantation of an implantable cardiac loop.  DISCHARGE MEDICATIONS:  None.  WOUND CARE:  The patient is instructed in appropriate wound care for the implanted cardiac loop monitor.  FOLLOWUP:  The patient is to call Dr. Ladona Ridgel at [phone] if fever, blackouts, or problems with his implant develop.  He is to return to see his physician at [facility] on Friday, September 01, 1999, at 8:30 a.m.  Return to see Dr. Debara Pickett on Tuesday, September 26, 1999, at 9:10 a.m. at the [facility].  HISTORY OF PRESENT ILLNESS:  [name] [name] is a 50 year old white male with no past medical history who presented with recurrent syncope.  He had not eaten breakfast, worked in his garden, went fishing, and returned home on the day of admission.  He had a syncopal episode while standing at the side of his truck after the aforementioned activities.  Syncope lasted 2 to 3 minutes, preceded by headache described as "squeezing".  He felt weak, nauseated, and diaphoretic afterwards.  He had no convulsions or incontinence.  He had three prior similar episodes, including 12 years ago, 8 years ago, and 3 years ago, usually while standing and physically active.  He had never been evaluated. He denied exertional chest pain, substernal pain, shortness of breath, or palpitations.  Please refer to the admission history and physical for past medical history, allergies, meds, social history, family medical history.  Of note, he denied illicit drugs, but admitted to occasional  alcohol.  PHYSICAL EXAMINATION:  VITAL SIGNS:  Temperature 97.3, blood pressure 104/50 laying down, 100/60 standing up, pulse 60 laying down, 64 standing up, respirations 16, oxygen saturation 97% on room air.  GENERAL:  In no acute distress, alert and oriented.  HEENT:  Unremarkable.  CARDIOVASCULAR:  Regular rate and rhythm, no thrill, no heave, no murmur, no rub.  S4 gallop, no JVD, no edema.  RESPIRATORY:  Clear, good air movement, symmetric.  ABDOMEN:  Benign.  EXTREMITIES:  Warm, dry, no edema.  NEUROLOGIC:  Unremarkable.  No pronator drift or ataxia.  LABORATORY DATA ON ADMISSION:  BMP completely normal.  CBC hemoglobin 13.4, white blood cells 12.6, platelets 257.  CK 106, MB 1.8, troponin-I less than 0.03.  Coagulation studies unremarkable.  EKG showed sinus bradycardia at 58 beats per minute, first degree block, intervals normal except PR 0.21, axis of +120, no significant ischemic findings.  However, cardiology reviewed the EKG and noted that he had some changes consistent with Brugadas syndrome.  A portable chest x-ray was unremarkable.  CT of head without contrast was unremarkable.  Urine drug screen obtained shortly after admission revealed cannabis use.  HOSPITAL COURSE:  [name] [name] was admitted for recurrent syncope.  We ruled out an acute myocardial infarction by serial cardiac enzymes, telemetry, and repeat EKG.  Cardiology was consulted for possible Brugadas syndrome.  A  more likely explanation, however, would have been neurocardiogenic syncope.  He underwent electrophysiologic test by undergoing a flecainide challenge with serial EKGs.  The flecainide challenge was not conclusive.  The patient underwent an implantable loop for further monitoring.  He will follow up with cardiology and with Dr. Sharl Ma on an outpatient basis as noted above. Otherwise, he is instructed not to drive for the next six months for the unexplained syncope.  No further medication  recommendations at present.  The patient was encouraged to consider stopping cannabis use as it may contribute to his syncope. DD:  11/01/99 TD:  11/02/99 Job: [id number]

## 2010-07-07 NOTE — Op Note (Signed)
[place]. [facility]  Patient:    [name] [name], [name] [name]                        MRN: [id number] Proc. Date: 08/11/99 Adm. Date:  [id number] Disc. Date: [id number] Attending:  Phifer, Harriett Sine Welcome CC:         Kathrine Cords, R.N.             Delsa Grana, R.N.                           Operative Report  PROCEDURE PERFORMED:  Implantation of an implantable cardiac looper.  INDICATIONS:  Unexplained syncope.  INTRODUCTION:  The patient is a 50 year old man who was admitted to the hospital after having a syncopal episode.  There is no clear-cut etiology of his syncope, although he did have a tox screen which demonstrated THC.  He underwent tilt table testing which demonstrated no evidence of neurocardiogenic syncope.  He subsequently is referred for implantation of a Medtronic implantable cardiac looper monitor.  PROCEDURE:  After informed consent was obtained the patient was taken to the diagnostic catheterization lab in a fasted state.  After usual preparation and draping a total of 30 cc of lidocaine was infiltrated into the mid sternal region just lateral to the mid sternal line approximately 4 cm below the clavicle.  A small 3 cm incision was carried out over this location and electrocautery was utilized to dissect down to the subpectoralis fascia.  The Medtronic Reveal Plus model T5181803, serial number [id number] was inserted into this space, and kanamycin irrigation was then utilized to irrigate the wound. The wound was secured to the fascia with silk suture.  Additional irrigation was then delivered.  There was no evidence of bleeding.  The wound was closed with a layer of 2-0 Vicryl, followed by a layer of 3-0 Vicryl, followed by a layer of 4-0 Vicryl.  Benzoin was painted on the skin and Steri-Strips were applied, and a pressure dressing placed.  The patient was returned to his room in good condition.  COMPLICATIONS:  None.  RESULTS:  This demonstrates  successfully implantation of a Medtronic Reveal Plus implantable cardiac looper monitor.  There were no immediate procedure complications. DD:  10/06/99 TD:  10/06/99 Job: [id number]

## 2010-07-07 NOTE — Consult Note (Signed)
[name], [name] [name]                 ACCOUNT NO.:  [id number]   MEDICAL RECORD NO.:  [id number]          PATIENT TYPE:  EMS   LOCATION:  MAJO                         FACILITY:  [facility]   PHYSICIAN:  Thomas C. Wall, M.D.   DATE OF BIRTH:  1960-11-21   DATE OF CONSULTATION:  DATE OF DISCHARGE:                                   CONSULTATION   Audio too short to transcribe (less than 5 seconds)      Thomas C. Wall, M.D.     TCW/MEDQ  D:  06/08/2005  T:  06/08/2005  Job:  [id number]

## 2010-07-07 NOTE — Consult Note (Signed)
[name] [name]                           ACCOUNT NO.:  [id number]   MEDICAL RECORD NO.:  [id number]                   PATIENT TYPE:  INP   LOCATION:  1825                                 FACILITY:  [facility]   PHYSICIAN:  [name] [name], M.D.                  DATE OF BIRTH:  [birth year]   DATE OF CONSULTATION:  08/02/2003  DATE OF DISCHARGE:                                   CONSULTATION   [name] is currently admitted to [facility] with an episode of  marked dizziness followed by syncope.  He has been bothered by this type of  problem over the years.  He had been evaluated by Dr. Lewayne [name] in the  past.  It is my understanding that there was some type of flecainide  challenge done in the past with equivocal results to consider the  possibility of Brugada syndrome.  The patient did have an implantable loop  in place for a prolonged period of time, and he had no marked episodes and  no documented arrhythmias during that period.  Most recently he has had more  symptoms, and today he had marked dizziness since the syncopal episode and  he is here for further evaluation.   PAST MEDICAL HISTORY:  Allergies:  No known drug allergies.   Medications:  No medications.   Other medical problems:  See the complete list below.   SOCIAL HISTORY:  The patient lives with his wife in [place].  He does  work at a sawmill.  He does not smoke.   FAMILY HISTORY:  There is no strong family history of coronary disease.   REVIEW OF SYSTEMS:  The patient has had some epistaxis from time to time.  He gets occasional chest tightness.  He has some arthralgias.  The remainder  of his review of systems is negative.   PHYSICAL EXAMINATION:  VITAL SIGNS:  Temperature is 97.0 with a pulse of  100, respirations 16, and blood pressure is 125/66.  He is stable at this  point.  HEENT:  No marked abnormalities.  NECK:  Normal.  CARDIAC:  No significant murmurs.  CHEST:  His lungs are clear.  RECTAL, GENITOURINARY:  Deferred.  ABDOMEN:  Soft.  He has no edema.   His current EKG reveals sinus rhythm with slight ST elevation in leads V1  and V2.  This is the basis most probably of the consideration of Brugada in  the past.  There is no true RSR prime.   PROBLEMS INCLUDE:  1. Some forms of arthritis.  Question rheumatoid arthritis.  2. History of syncope with a slightly abnormal EKG, prior implantable loop     showing no significant abnormalities.  At this point no further tests     will be recommended today other than a follow-up 2 D echo.  The patient     will be seen for EP  evaluation by our electrophysiology team on August 03, 2003.                                               [name] [name], M.D.    [name] [name]  D:  08/02/2003  T:  08/02/2003  Job:  [id number]   cc:   [name] [name], M.D.

## 2010-07-07 NOTE — Op Note (Signed)
[place]. [facility]  Patient:    [name] [name], [name] [name] Visit Number: [id number] MRN: [id number]          Service Type: CAT Location: [facility] [address] 13 Attending Physician:  Lewayne Bunting Dictated by:   Doylene Canning. Ladona Ridgel, M.D. [facility] Proc. Date: 07/25/01 Admit Date:  07/25/2001 Discharge Date: 07/25/2001   CC:         Paulita Cradle, [place], [place]  Delrae Rend, [facility]  EP Lab   Operative Report  PROCEDURE PERFORMED:  Explantation of an implantable loop recorder.  INDICATIONS:  Device being at end of life.  COMPLICATIONS:  None.  I:    INTRODUCTION:  The patient is a very pleasant 50 year old male with unexplained syncope, who underwent an implantable loop recorder insertion two years ago.  He has had no syncopal episodes and no documented bradycardia or tachycardia with his implantable loop recorder.  He is now referred for removal of his loop recorder secondary to being at end of life.  II:   DESCRIPTION OF PROCEDURE:  After informed consent was obtained, the patient was taken to the diagnostic EP lab in the fasted state.  After anesthesia and preparation, approximately 30 cc of lidocaine was infiltrated into the area at the insertion site in the left pectoral region.  A 5 cm incision was carried out over this region and electrocautery utilized to dissect down to the implantable loop recorder.  It was removed with gentle traction.  The pocket was irrigated with clindamycin and the incision closed with a layer of 2-0 Vicryl, followed by a layer of 3-0 Vicryl, followed by a layer of 4-0 Vicryl.  Benzoin was painted on the skin; Steri-Strips were applied, and a pressure dressing placed.  The patient returned to his room in satisfactory condition.  Of note, during the procedure, the patient developed an episode of nausea and diaphoresis and stated that he felt like he was going to pass out.  During that time, his heart rate went down into the low 50s, but  there was no frank syncope.  III:  COMPLICATIONS:  There were no immediate procedural complications.  IV:   RESULTS:  Demonstrates successful removal of implantable loop recorder without any immediate procedural complications. Dictated by:   Doylene Canning. Ladona Ridgel, M.D. [facility] Attending Physician:  Lewayne Bunting DD:  07/25/01 TD:  07/28/01 Job: [id number]

## 2010-07-07 NOTE — Op Note (Signed)
[name] [name], DATTA                           ACCOUNT NO.:  [id number]   MEDICAL RECORD NO.:  [id number]                   PATIENT TYPE:  INP   LOCATION:  2016                                 FACILITY:  [facility]   PHYSICIAN:  Duke Salvia, M.D.               DATE OF BIRTH:  11-20-1960   DATE OF PROCEDURE:  08/05/2003  DATE OF DISCHARGE:                                 OPERATIVE REPORT   [name] was submitted to head-up tilt table testing.  He manifested  significant changes in heart rate from the 60 range to the 100 range with  changes in position, without significant change in blood pressure   The heart rate stabilized in the 90 range with stable blood pressure in the  110 range.   The patient was returned to the supine position.  Isoproterenol was started.  He was tilted upright to 70 degrees.  He began hyperventilating, and there  was a significant change in his heart rate from 139 to 75, consistent with a  paradoxical cardioinhibitory response.   IMPRESSION:  1. Possible orthostatic tachycardia.  2. Suggestion of neurally-mediated syncope.                                               Duke Salvia, M.D.    SCK/MEDQ  D:  08/05/2003  T:  08/06/2003  Job:  [id number]

## 2010-08-15 ENCOUNTER — Encounter: Payer: Self-pay | Admitting: Cardiology

## 2010-09-04 ENCOUNTER — Encounter: Payer: Self-pay | Admitting: Internal Medicine

## 2010-10-06 ENCOUNTER — Ambulatory Visit (INDEPENDENT_AMBULATORY_CARE_PROVIDER_SITE_OTHER): Payer: Self-pay | Admitting: Internal Medicine

## 2010-10-06 ENCOUNTER — Encounter: Payer: Self-pay | Admitting: Internal Medicine

## 2010-10-06 VITALS — BP 134/80 | HR 50 | Ht 69.0 in | Wt 208.0 lb

## 2010-10-06 DIAGNOSIS — R55 Syncope and collapse: Secondary | ICD-10-CM

## 2010-10-06 DIAGNOSIS — R079 Chest pain, unspecified: Secondary | ICD-10-CM

## 2010-10-06 MED ORDER — FLUDROCORTISONE ACETATE 0.1 MG PO TABS: 0.1000 mg | ORAL_TABLET | Freq: Two times a day (BID) | ORAL | Status: DC

## 2010-10-06 NOTE — Patient Instructions (Signed)
Your physician wants you to follow-up in: 6 months with Dr Court Joy will receive a reminder letter in the mail two months in advance. If you don't receive a letter, please call our office to schedule the follow-up appointment.   It is very important that you stay hydrated and keep taking your Florinef 0.1mg  one twice daily

## 2010-10-06 NOTE — Assessment & Plan Note (Signed)
The patient's symptoms continue to be bothersome. I have strongly encouraged him to start taking fludrocortisone again. In addition he will continue his salt fluid intake. I have asked that he stop using marijuana. Also caffeine reduction has been recommended. I will plan to see the patient back in several months.

## 2010-10-06 NOTE — Assessment & Plan Note (Signed)
His symptoms are nonexertional. A period of watchful waiting has been advised.

## 2010-10-06 NOTE — Progress Notes (Signed)
HPI [name] returns today for followup. He is a 50 year old man with a history of recurrent neurally mediated syncope. He continues to have multiple spells. Previously he had been seen by me and prescribed fludrocortisone. He had been on one to take this medication side and increased expense. The patient has been under increasing stress as both at home and with the family and at work. 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 He has tried to increase his fluid and sodium intake. Unfortunately he has not taken his fludrocortisone. In addition the patient admits to using marijuana. He has had several syncopal episodes. In each episode, the patient has a prodrome and then passes out. He usually feels the episodes coming but has not been able to sit down or lie down enough. They always occur while standing. No Known Allergies   Current Outpatient Prescriptions  Medication Sig Dispense Refill  . fludrocortisone (FLORINEF) 0.1 MG tablet Take 1 tablet (0.1 mg total) by mouth 2 (two) times daily.  60 tablet  11     Past Medical History  Diagnosis Date  . Tachy-brady syndrome   . Vasovagal syncope   . Syncopal episodes   . S/P hernia repair   . Substance abuse     Marijuana    ROS:   All systems reviewed and negative except as noted in the HPI.   Past Surgical History  Procedure Date  . Cardiac electrophysiology study & dft   . Implantation of cardio loop   . Hernia repair   . Head up tilt table testing 08/04/2003     Family History  Problem Relation Age of Onset  . Hypertension Neg Hx   . Diabetes Neg Hx   . Coronary artery disease Neg Hx      History   Social History  . Marital Status: Single    Spouse Name: N/A    Number of Children: N/A  . Years of Education: N/A   Occupational History  . Full time    Social History Main Topics  . Smoking status: Never Smoker   . Smokeless tobacco: Not on file  . Alcohol Use: Yes   Occasional  . Drug Use: Yes    Special: Marijuana     Occasional  . Sexually Active: Not on file   Other Topics Concern  . Not on file   Social History Narrative   Married     BP 134/80  Pulse 50  Ht 5\' 9"  (1.753 m)  Wt 208 lb (94.348 kg)  BMI 30.72 kg/m2  Physical Exam:  Well appearing NAD HEENT: Unremarkable Neck:  No JVD, no thyromegally Lymphatics:  No adenopathy Back:  No CVA tenderness Lungs:  Clear HEART:  Regular rate rhythm, no murmurs, no rubs, no clicks Abd:  soft, positive bowel sounds, no organomegally, no rebound, no guarding Ext:  2 plus pulses, no edema, no cyanosis, no clubbing Skin:  No rashes no nodules Neuro:  CN II through XII intact, motor grossly intact  EKG Sinus bradycardia  Assess/Plan:

## 2011-02-16 ENCOUNTER — Telehealth: Payer: Self-pay | Admitting: Internal Medicine

## 2011-02-16 NOTE — Telephone Encounter (Signed)
Echo,Stress faxed to [facility]/Dr.R. Orson Aloe @ [phone] 02/16/11/km

## 2011-03-01 HISTORY — PX: PACEMAKER IMPLANT: EP1218

## 2015-04-21 ENCOUNTER — Other Ambulatory Visit: Payer: Self-pay | Admitting: Orthopaedic Surgery

## 2015-05-02 ENCOUNTER — Encounter (HOSPITAL_COMMUNITY): Payer: Self-pay

## 2015-05-02 ENCOUNTER — Encounter (HOSPITAL_COMMUNITY)
Admission: RE | Admit: 2015-05-02 | Discharge: 2015-05-02 | Disposition: A | Discharge status: Home / Self Care | Payer: Medicare Other | Source: Ambulatory Visit | Attending: Orthopaedic Surgery | Admitting: Orthopaedic Surgery

## 2015-05-02 HISTORY — DX: Nausea with vomiting, unspecified: R11.2

## 2015-05-02 HISTORY — DX: Presence of cardiac pacemaker: Z95.0

## 2015-05-02 HISTORY — DX: Cervicalgia: M54.2

## 2015-05-02 HISTORY — DX: Personal history of other diseases of the musculoskeletal system and connective tissue: Z87.39

## 2015-05-02 HISTORY — DX: Personal history of other infectious and parasitic diseases: Z86.19

## 2015-05-02 HISTORY — DX: Chronic obstructive pulmonary disease, unspecified: J44.9

## 2015-05-02 HISTORY — DX: Weakness: R53.1

## 2015-05-02 HISTORY — DX: Personal history of other diseases of the digestive system: Z87.19

## 2015-05-02 HISTORY — DX: Essential (primary) hypertension: I10

## 2015-05-02 HISTORY — DX: Other specified postprocedural states: Z98.890

## 2015-05-02 HISTORY — DX: Nocturia: R35.1

## 2015-05-02 HISTORY — DX: Unspecified osteoarthritis, unspecified site: M19.90

## 2015-05-02 LAB — COMPREHENSIVE METABOLIC PANEL
ALBUMIN: 4.3 g/dL (ref 3.5–5.0)
ALK PHOS: 57 U/L (ref 38–126)
ALT: 17 U/L (ref 17–63)
ANION GAP: 13 (ref 5–15)
AST: 21 U/L (ref 15–41)
BILIRUBIN TOTAL: 0.6 mg/dL (ref 0.3–1.2)
BUN: 16 mg/dL (ref 6–20)
CALCIUM: 9.7 mg/dL (ref 8.9–10.3)
CO2: 21 mmol/L — ABNORMAL LOW (ref 22–32)
Chloride: 106 mmol/L (ref 101–111)
Creatinine, Ser: 1.03 mg/dL (ref 0.61–1.24)
GFR calc Af Amer: >60 mL/min (ref >60)
GLUCOSE: 91 mg/dL (ref 65–99)
POTASSIUM: 3.8 mmol/L (ref 3.5–5.1)
Sodium: 140 mmol/L (ref 135–145)
TOTAL PROTEIN: 7.2 g/dL (ref 6.5–8.1)

## 2015-05-02 LAB — CBC
HEMATOCRIT: 46.3 % (ref 39.0–52.0)
HEMOGLOBIN: 15.7 g/dL (ref 13.0–17.0)
MCH: 31.4 pg (ref 26.0–34.0)
MCHC: 33.9 g/dL (ref 30.0–36.0)
MCV: 92.6 fL (ref 78.0–100.0)
Platelets: 243 10*3/uL (ref 150–400)
RBC: 5 MIL/uL (ref 4.22–5.81)
RDW: 12.7 % (ref 11.5–15.5)
WBC: 9.2 10*3/uL (ref 4.0–10.5)

## 2015-05-02 LAB — SURGICAL PCR SCREEN
MRSA, PCR: NEGATIVE
Staphylococcus aureus: NEGATIVE

## 2015-05-02 LAB — PROTIME-INR
INR: 1.02 (ref 0.00–1.49)
PROTHROMBIN TIME: 13.6 s (ref 11.6–15.2)

## 2015-05-02 LAB — URINALYSIS, ROUTINE W REFLEX MICROSCOPIC
BILIRUBIN URINE: NEGATIVE
Glucose, UA: NEGATIVE mg/dL
HGB URINE DIPSTICK: NEGATIVE
KETONES UR: NEGATIVE mg/dL
Leukocytes, UA: NEGATIVE
Nitrite: NEGATIVE
PH: 6.5 (ref 5.0–8.0)
Protein, ur: NEGATIVE mg/dL
SPECIFIC GRAVITY, URINE: 1.005 (ref 1.005–1.030)

## 2015-05-02 LAB — APTT: APTT: 31 s (ref 24–37)

## 2015-05-02 MED ORDER — CHLORHEXIDINE GLUCONATE 4 % EX LIQD: 60.0000 mL | Freq: Once | CUTANEOUS | Status: DC

## 2015-05-02 NOTE — Progress Notes (Addendum)
Cardiologist is Dr.Tony Sharol HarnessSimmons with [facility].Last visit was about 5 months ago-to request  Medical Md doesn't have one  Echo report in epic from 2005  Stress test report in epic from 2012  Heart cath denies  EKG to request from Dr.Simmons  CXR denies in past yr

## 2015-05-02 NOTE — Pre-Procedure Instructions (Signed)
Katherina RightDanny L Knies  05/02/2015      [facility] - [place], [place] - [address] [place] [id number] Phone: [phone] Fax: [phone]    Your procedure is scheduled on Wed, Mar 15 @ 12:30 PM  [facility] Admitting at 10:30 AM  Call this number if you have problems the morning of surgery:  [phone]   Remember:  Do not eat food or drink liquids after midnight.  Take these medicines the morning of surgery with A SIP OF WATER Florinef(Fludrocortisone)             No Goody's,BC's,Aleve,Aspirin,Ibuprofen,Motrin,Advil,Fish Oil,or any Herbal Medications.    Do not wear jewelry.  Do not wear lotions, powders, or colognes.  You may wear deodorant.  Men may shave face and neck.  Do not bring valuables to the hospital.  [facility] is not responsible for any belongings or valuables.  Contacts, dentures or bridgework may not be worn into surgery.  Leave your suitcase in the car.  After surgery it may be brought to your room.  For patients admitted to the hospital, discharge time will be determined by your treatment team.  Patients discharged the day of surgery will not be allowed to drive home.    Special instructions:  [place] - Preparing for Surgery  Before surgery, you can play an important role.  Because skin is not sterile, your skin needs to be as free of germs as possible.  You can reduce the number of germs on you skin by washing with CHG (chlorahexidine gluconate) soap before surgery.  CHG is an antiseptic cleaner which kills germs and bonds with the skin to continue killing germs even after washing.  Please DO NOT use if you have an allergy to CHG or antibacterial soaps.  If your skin becomes reddened/irritated stop using the CHG and inform your nurse when you arrive at Short Stay.  Do not shave (including legs and underarms) for at least 48 hours prior to the first CHG shower.  You may shave your face.  Please  follow these instructions carefully:   1.  Shower with CHG Soap the night before surgery and the                                morning of Surgery.  2.  If you choose to wash your hair, wash your hair first as usual with your       normal shampoo.  3.  After you shampoo, rinse your hair and body thoroughly to remove the                      Shampoo.  4.  Use CHG as you would any other liquid soap.  You can apply chg directly       to the skin and wash gently with scrungie or a clean washcloth.  5.  Apply the CHG Soap to your body ONLY FROM THE NECK DOWN.        Do not use on open wounds or open sores.  Avoid contact with your eyes,       ears, mouth and genitals (private parts).  Wash genitals (private parts)       with your normal soap.  6.  Wash thoroughly, paying special attention to the area where your surgery  will be performed.  7.  Thoroughly rinse your body with warm water from the neck down.  8.  DO NOT shower/wash with your normal soap after using and rinsing off       the CHG Soap.  9.  Pat yourself dry with a clean towel.            10.  Wear clean pajamas.            11.  Place clean sheets on your bed the night of your first shower and do not        sleep with pets.  Day of Surgery  Do not apply any lotions/deoderants the morning of surgery.  Please wear clean clothes to the hospital/surgery center.    Please read over the following fact sheets that you were given. Pain Booklet, Coughing and Deep Breathing, MRSA Information and Surgical Site Infection Prevention

## 2015-05-03 MED ORDER — CEFAZOLIN SODIUM-DEXTROSE 2-3 GM-% IV SOLR
2.0000 g | INTRAVENOUS | Status: AC
  Administered 2015-05-04: 2 g via INTRAVENOUS
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
## 2015-05-04 ENCOUNTER — Inpatient Hospital Stay (HOSPITAL_COMMUNITY): Payer: Medicare Other | Admitting: Vascular Surgery

## 2015-05-04 ENCOUNTER — Encounter (HOSPITAL_COMMUNITY)
Admission: RE | Disposition: A | Discharge status: Home / Self Care | Payer: Self-pay | Source: Ambulatory Visit | Attending: Orthopaedic Surgery

## 2015-05-04 ENCOUNTER — Encounter (HOSPITAL_COMMUNITY): Payer: Self-pay | Admitting: Surgery

## 2015-05-04 ENCOUNTER — Inpatient Hospital Stay (HOSPITAL_COMMUNITY): Payer: Medicare Other | Admitting: Certified Registered Nurse Anesthetist

## 2015-05-04 ENCOUNTER — Inpatient Hospital Stay (HOSPITAL_COMMUNITY)
Admission: RE | Admit: 2015-05-04 | Discharge: 2015-05-05 | DRG: 473 | Disposition: A | Discharge status: Home / Self Care | Payer: Medicare Other | Source: Ambulatory Visit | Attending: Orthopaedic Surgery | Admitting: Orthopaedic Surgery

## 2015-05-04 ENCOUNTER — Inpatient Hospital Stay (HOSPITAL_COMMUNITY): Payer: Medicare Other

## 2015-05-04 DIAGNOSIS — Z950 Presence of cardiac pacemaker: Secondary | ICD-10-CM | POA: Diagnosis not present

## 2015-05-04 DIAGNOSIS — M4802 Spinal stenosis, cervical region: Secondary | ICD-10-CM | POA: Diagnosis present

## 2015-05-04 DIAGNOSIS — Z419 Encounter for procedure for purposes other than remedying health state, unspecified: Secondary | ICD-10-CM

## 2015-05-04 DIAGNOSIS — M47812 Spondylosis without myelopathy or radiculopathy, cervical region: Principal | ICD-10-CM | POA: Diagnosis present

## 2015-05-04 DIAGNOSIS — Z23 Encounter for immunization: Secondary | ICD-10-CM

## 2015-05-04 DIAGNOSIS — J449 Chronic obstructive pulmonary disease, unspecified: Secondary | ICD-10-CM | POA: Diagnosis present

## 2015-05-04 DIAGNOSIS — I10 Essential (primary) hypertension: Secondary | ICD-10-CM | POA: Diagnosis present

## 2015-05-04 DIAGNOSIS — Z981 Arthrodesis status: Secondary | ICD-10-CM

## 2015-05-04 DIAGNOSIS — Z87891 Personal history of nicotine dependence: Secondary | ICD-10-CM | POA: Diagnosis not present

## 2015-05-04 HISTORY — PX: ANTERIOR CERVICAL DECOMP/DISCECTOMY FUSION: SHX1161

## 2015-05-04 HISTORY — DX: Depression, unspecified: F32.A

## 2015-05-04 HISTORY — DX: Headache: R51

## 2015-05-04 HISTORY — DX: Angina pectoris, unspecified: I20.9

## 2015-05-04 HISTORY — DX: Shortness of breath: R06.02

## 2015-05-04 HISTORY — DX: Headache, unspecified: R51.9

## 2015-05-04 HISTORY — DX: Gastro-esophageal reflux disease without esophagitis: K21.9

## 2015-05-04 HISTORY — DX: Major depressive disorder, single episode, unspecified: F32.9

## 2015-05-04 SURGERY — ANTERIOR CERVICAL DECOMPRESSION/DISCECTOMY FUSION 1 LEVEL
Anesthesia: General | Site: Spine Cervical

## 2015-05-04 MED ORDER — PNEUMOCOCCAL VAC POLYVALENT 25 MCG/0.5ML IJ INJ
0.5000 mL | INJECTION | INTRAMUSCULAR | Status: AC
  Administered 2015-05-05: 0.5 mL via INTRAMUSCULAR
  Filled 2015-05-04: qty 0.5

## 2015-05-04 MED ORDER — METHOCARBAMOL 1000 MG/10ML IJ SOLN
500.0000 mg | Freq: Four times a day (QID) | INTRAVENOUS | Status: DC | PRN
  Filled 2015-05-04: qty 5

## 2015-05-04 MED ORDER — LACTATED RINGERS IV SOLN
INTRAVENOUS | Status: DC
  Administered 2015-05-04 (×2): via INTRAVENOUS

## 2015-05-04 MED ORDER — DOCUSATE SODIUM 100 MG PO CAPS
100.0000 mg | ORAL_CAPSULE | Freq: Two times a day (BID) | ORAL | Status: DC
  Administered 2015-05-04 – 2015-05-05 (×2): 100 mg via ORAL
  Filled 2015-05-04 (×2): qty 1

## 2015-05-04 MED ORDER — PHENOL 1.4 % MT LIQD: 1.0000 | OROMUCOSAL | Status: DC | PRN

## 2015-05-04 MED ORDER — HYDROMORPHONE HCL 1 MG/ML IJ SOLN: 0.5000 mg | INTRAMUSCULAR | Status: DC | PRN

## 2015-05-04 MED ORDER — ONDANSETRON HCL 4 MG/2ML IJ SOLN
INTRAMUSCULAR | Status: DC | PRN
  Administered 2015-05-04: 4 mg via INTRAVENOUS

## 2015-05-04 MED ORDER — SCOPOLAMINE 1 MG/3DAYS TD PT72
MEDICATED_PATCH | TRANSDERMAL | Status: AC
  Filled 2015-05-04: qty 1

## 2015-05-04 MED ORDER — DEXAMETHASONE SODIUM PHOSPHATE 4 MG/ML IJ SOLN
INTRAMUSCULAR | Status: DC | PRN
  Administered 2015-05-04: 8 mg via INTRAVENOUS

## 2015-05-04 MED ORDER — OXYCODONE HCL 5 MG PO TABS
ORAL_TABLET | ORAL | Status: AC
  Filled 2015-05-04: qty 1

## 2015-05-04 MED ORDER — MIDAZOLAM HCL 2 MG/2ML IJ SOLN
INTRAMUSCULAR | Status: AC
  Filled 2015-05-04: qty 2

## 2015-05-04 MED ORDER — SODIUM CHLORIDE 0.9 % IV SOLN
250.0000 mL | INTRAVENOUS | Status: DC
  Administered 2015-05-05: 250 mL via INTRAVENOUS

## 2015-05-04 MED ORDER — METHOCARBAMOL 500 MG PO TABS
500.0000 mg | ORAL_TABLET | Freq: Four times a day (QID) | ORAL | Status: DC | PRN
  Administered 2015-05-04 – 2015-05-05 (×3): 500 mg via ORAL
  Filled 2015-05-04 (×2): qty 1

## 2015-05-04 MED ORDER — ONDANSETRON HCL 4 MG/2ML IJ SOLN
INTRAMUSCULAR | Status: AC
  Filled 2015-05-04: qty 2

## 2015-05-04 MED ORDER — METHOCARBAMOL 500 MG PO TABS
ORAL_TABLET | ORAL | Status: AC
  Filled 2015-05-04: qty 1

## 2015-05-04 MED ORDER — SUGAMMADEX SODIUM 200 MG/2ML IV SOLN
INTRAVENOUS | Status: DC | PRN
  Administered 2015-05-04: 200 mg via INTRAVENOUS

## 2015-05-04 MED ORDER — ARTIFICIAL TEARS OP OINT
TOPICAL_OINTMENT | OPHTHALMIC | Status: AC
  Filled 2015-05-04: qty 3.5

## 2015-05-04 MED ORDER — CEFAZOLIN SODIUM 1-5 GM-% IV SOLN
1.0000 g | Freq: Three times a day (TID) | INTRAVENOUS | Status: AC
  Administered 2015-05-04 – 2015-05-05 (×2): 1 g via INTRAVENOUS
  Filled 2015-05-04 (×2): qty 50

## 2015-05-04 MED ORDER — MIDAZOLAM HCL 5 MG/5ML IJ SOLN
INTRAMUSCULAR | Status: DC | PRN
  Administered 2015-05-04: 2 mg via INTRAVENOUS

## 2015-05-04 MED ORDER — SODIUM CHLORIDE 0.9% FLUSH
3.0000 mL | Freq: Two times a day (BID) | INTRAVENOUS | Status: DC
  Administered 2015-05-05: 3 mL via INTRAVENOUS

## 2015-05-04 MED ORDER — OXYCODONE-ACETAMINOPHEN 5-325 MG PO TABS
1.0000 | ORAL_TABLET | Freq: Four times a day (QID) | ORAL | Status: DC | PRN
  Administered 2015-05-04 – 2015-05-05 (×4): 2 via ORAL
  Filled 2015-05-04 (×3): qty 2

## 2015-05-04 MED ORDER — SUGAMMADEX SODIUM 200 MG/2ML IV SOLN
INTRAVENOUS | Status: AC
  Filled 2015-05-04: qty 2

## 2015-05-04 MED ORDER — SODIUM CHLORIDE 0.45 % IV SOLN: INTRAVENOUS | Status: DC

## 2015-05-04 MED ORDER — FENTANYL CITRATE (PF) 250 MCG/5ML IJ SOLN
INTRAMUSCULAR | Status: AC
  Filled 2015-05-04: qty 5

## 2015-05-04 MED ORDER — HYDROMORPHONE HCL 1 MG/ML IJ SOLN
INTRAMUSCULAR | Status: AC
  Filled 2015-05-04: qty 1

## 2015-05-04 MED ORDER — ROCURONIUM BROMIDE 50 MG/5ML IV SOLN
INTRAVENOUS | Status: AC
  Filled 2015-05-04: qty 2

## 2015-05-04 MED ORDER — HEMOSTATIC AGENTS (NO CHARGE) OPTIME
TOPICAL | Status: DC | PRN
  Administered 2015-05-04: 1 via TOPICAL

## 2015-05-04 MED ORDER — PROPOFOL 10 MG/ML IV BOLUS
INTRAVENOUS | Status: AC
  Filled 2015-05-04: qty 20

## 2015-05-04 MED ORDER — PROPOFOL 10 MG/ML IV BOLUS
INTRAVENOUS | Status: DC | PRN
  Administered 2015-05-04: 130 mg via INTRAVENOUS

## 2015-05-04 MED ORDER — BUPIVACAINE-EPINEPHRINE 0.5% -1:200000 IJ SOLN
INTRAMUSCULAR | Status: DC | PRN
Start: 2015-05-04 — End: 2015-05-04
  Administered 2015-05-04: 6 mL

## 2015-05-04 MED ORDER — HYDROMORPHONE HCL 1 MG/ML IJ SOLN
0.2500 mg | INTRAMUSCULAR | Status: DC | PRN
  Administered 2015-05-04 (×4): 0.5 mg via INTRAVENOUS

## 2015-05-04 MED ORDER — DIPHENHYDRAMINE HCL 50 MG/ML IJ SOLN
INTRAMUSCULAR | Status: AC
  Filled 2015-05-04: qty 1

## 2015-05-04 MED ORDER — ACETAMINOPHEN 325 MG PO TABS: 650.0000 mg | ORAL_TABLET | ORAL | Status: DC | PRN

## 2015-05-04 MED ORDER — BUPIVACAINE-EPINEPHRINE (PF) 0.5% -1:200000 IJ SOLN
INTRAMUSCULAR | Status: AC
  Filled 2015-05-04: qty 30

## 2015-05-04 MED ORDER — SCOPOLAMINE 1 MG/3DAYS TD PT72
MEDICATED_PATCH | TRANSDERMAL | Status: DC | PRN
  Administered 2015-05-04: 1 via TRANSDERMAL

## 2015-05-04 MED ORDER — ACETAMINOPHEN 650 MG RE SUPP: 650.0000 mg | RECTAL | Status: DC | PRN

## 2015-05-04 MED ORDER — LIDOCAINE HCL (CARDIAC) 20 MG/ML IV SOLN
INTRAVENOUS | Status: DC | PRN
  Administered 2015-05-04: 60 mg via INTRATRACHEAL
  Administered 2015-05-04: 40 mg via INTRAVENOUS

## 2015-05-04 MED ORDER — DIPHENHYDRAMINE HCL 50 MG/ML IJ SOLN
INTRAMUSCULAR | Status: DC | PRN
  Administered 2015-05-04: 12.5 mg via INTRAVENOUS

## 2015-05-04 MED ORDER — OXYCODONE-ACETAMINOPHEN 5-325 MG PO TABS
ORAL_TABLET | ORAL | Status: AC
  Filled 2015-05-04: qty 2

## 2015-05-04 MED ORDER — ROCURONIUM BROMIDE 100 MG/10ML IV SOLN
INTRAVENOUS | Status: DC | PRN
  Administered 2015-05-04: 50 mg via INTRAVENOUS
  Administered 2015-05-04: 20 mg via INTRAVENOUS

## 2015-05-04 MED ORDER — SODIUM CHLORIDE 0.9% FLUSH: 3.0000 mL | INTRAVENOUS | Status: DC | PRN

## 2015-05-04 MED ORDER — FENTANYL CITRATE (PF) 100 MCG/2ML IJ SOLN
INTRAMUSCULAR | Status: DC | PRN
  Administered 2015-05-04: 100 ug via INTRAVENOUS
  Administered 2015-05-04 (×3): 50 ug via INTRAVENOUS

## 2015-05-04 MED ORDER — POLYETHYLENE GLYCOL 3350 17 G PO PACK: 17.0000 g | PACK | Freq: Every day | ORAL | Status: DC | PRN

## 2015-05-04 MED ORDER — DEXAMETHASONE SODIUM PHOSPHATE 4 MG/ML IJ SOLN
INTRAMUSCULAR | Status: AC
  Filled 2015-05-04: qty 2

## 2015-05-04 MED ORDER — 0.9 % SODIUM CHLORIDE (POUR BTL) OPTIME
TOPICAL | Status: DC | PRN
  Administered 2015-05-04: 1000 mL

## 2015-05-04 MED ORDER — MENTHOL 3 MG MT LOZG: 1.0000 | LOZENGE | OROMUCOSAL | Status: DC | PRN

## 2015-05-04 MED ORDER — ONDANSETRON HCL 4 MG/2ML IJ SOLN: 4.0000 mg | INTRAMUSCULAR | Status: DC | PRN

## 2015-05-04 SURGICAL SUPPLY — 53 items
APL SKNCLS STERI-STRIP NONHPOA (GAUZE/BANDAGES/DRESSINGS) ×2
BENZOIN TINCTURE PRP APPL 2/3 (GAUZE/BANDAGES/DRESSINGS) ×3 IMPLANT
BIT DRILL SRG 14X2.2XFLT CHK (BIT) IMPLANT
BIT DRL SRG 14X2.2XFLT CHK (BIT) ×1
BLADE SURG ROTATE 9660 (MISCELLANEOUS) IMPLANT
BONE CERV LORDOTIC 14.5X12X7 (Bone Implant) ×2 IMPLANT
BUR ROUND FLUTED 4 SOFT TCH (BURR) ×1 IMPLANT
CANISTER SUCTION WELLS/JOHNSON (MISCELLANEOUS) ×1 IMPLANT
COLLAR CERV LO CONTOUR FIRM DE (SOFTGOODS) ×2 IMPLANT
COVER MAYO STAND STRL (DRAPES) ×2 IMPLANT
COVER SURGICAL LIGHT HANDLE (MISCELLANEOUS) ×2 IMPLANT
CRADLE DONUT ADULT HEAD (MISCELLANEOUS) ×2 IMPLANT
DRAPE C-ARM 42X72 X-RAY (DRAPES) ×2 IMPLANT
DRAPE MICROSCOPE LEICA (MISCELLANEOUS) ×2 IMPLANT
DRAPE PROXIMA HALF (DRAPES) ×2 IMPLANT
DRILL BIT SKYLINE 14MM (BIT) ×2
DURAPREP 6ML APPLICATOR 50/CS (WOUND CARE) ×2 IMPLANT
ELECT COATED BLADE 2.86 ST (ELECTRODE) ×2 IMPLANT
ELECT REM PT RETURN 9FT ADLT (ELECTROSURGICAL) ×2
ELECTRODE REM PT RTRN 9FT ADLT (ELECTROSURGICAL) ×1 IMPLANT
EVACUATOR 1/8 PVC DRAIN (DRAIN) ×2 IMPLANT
GAUZE SPONGE 4X4 12PLY STRL (GAUZE/BANDAGES/DRESSINGS) ×2 IMPLANT
GLOVE BIOGEL PI IND STRL 8 (GLOVE) ×2 IMPLANT
GLOVE BIOGEL PI INDICATOR 8 (GLOVE) ×2
GLOVE ORTHO TXT STRL SZ7.5 (GLOVE) ×4 IMPLANT
GOWN STRL REUS W/ TWL LRG LVL3 (GOWN DISPOSABLE) ×1 IMPLANT
GOWN STRL REUS W/ TWL XL LVL3 (GOWN DISPOSABLE) ×1 IMPLANT
GOWN STRL REUS W/TWL 2XL LVL3 (GOWN DISPOSABLE) ×2 IMPLANT
GOWN STRL REUS W/TWL LRG LVL3 (GOWN DISPOSABLE) ×2
GOWN STRL REUS W/TWL XL LVL3 (GOWN DISPOSABLE) ×2
GRAFT BNE SPCR VG2 14.5X12X7 (Bone Implant) IMPLANT
HEAD HALTER (SOFTGOODS) ×2 IMPLANT
KIT BASIN OR (CUSTOM PROCEDURE TRAY) ×2 IMPLANT
KIT ROOM TURNOVER OR (KITS) ×2 IMPLANT
MANIFOLD NEPTUNE II (INSTRUMENTS) ×1 IMPLANT
NDL 25GX 5/8IN NON SAFETY (NEEDLE) ×1 IMPLANT
NEEDLE 25GX 5/8IN NON SAFETY (NEEDLE) ×2 IMPLANT
NS IRRIG 1000ML POUR BTL (IV SOLUTION) ×2 IMPLANT
PACK ORTHO CERVICAL (CUSTOM PROCEDURE TRAY) ×2 IMPLANT
PAD ARMBOARD 7.5X6 YLW CONV (MISCELLANEOUS) ×4 IMPLANT
PATTIES SURGICAL .5 X.5 (GAUZE/BANDAGES/DRESSINGS) IMPLANT
PLATE SKYLINE 12MM (Plate) ×1 IMPLANT
SCREW VAR SELF TAP SKYLINE 14M (Screw) ×4 IMPLANT
STRIP CLOSURE SKIN 1/2X4 (GAUZE/BANDAGES/DRESSINGS) ×2 IMPLANT
SURGIFLO W/THROMBIN 8M KIT (HEMOSTASIS) ×1 IMPLANT
SUT BONE WAX W31G (SUTURE) ×2 IMPLANT
SUT VIC AB 3-0 X1 27 (SUTURE) ×2 IMPLANT
SUT VIC AB 4-0 PS2 27 (SUTURE) ×1 IMPLANT
SUT VIC AB 4-0 SH 27 (SUTURE) ×2
SUT VIC AB 4-0 SH 27XBRD (SUTURE) ×1 IMPLANT
SYR BULB 3OZ (MISCELLANEOUS) ×2 IMPLANT
TOWEL OR 17X24 6PK STRL BLUE (TOWEL DISPOSABLE) ×2 IMPLANT
TOWEL OR 17X26 10 PK STRL BLUE (TOWEL DISPOSABLE) ×2 IMPLANT

## 2015-05-04 NOTE — Transfer of Care (Signed)
Immediate Anesthesia Transfer of Care Note  Patient: [name] [name]  Procedure(s) Performed: Procedure(s): C6-7 Anterior Cervical Discectomy and Fusion, Allograft, Plate  (N/A)  Patient Location: PACU  Anesthesia Type:General  Level of Consciousness: awake and alert   Airway & Oxygen Therapy: Patient Spontanous Breathing and Patient connected to nasal cannula oxygen  Post-op Assessment: Report given to RN and Post -op Vital signs reviewed and stable  Post vital signs: Reviewed and stable  Last Vitals:  Filed Vitals:   05/04/15 1025  BP: 121/82  Pulse: 83  Temp: 36.6 C    Complications: No apparent anesthesia complications

## 2015-05-04 NOTE — Interval H&P Note (Signed)
History and Physical Interval Note:  05/04/2015 12:14 PM  [name] [name]  has presented today for surgery, with the diagnosis of C6-7 Spondylosis  The various methods of treatment have been discussed with the patient and family. After consideration of risks, benefits and other options for treatment, the patient has consented to  Procedure(s): C6-7 Anterior Cervical Discectomy and Fusion, Allograft, Plate  (N/A) as a surgical intervention .  The patient's history has been reviewed, patient examined, no change in status, stable for surgery.  I have reviewed the patient's chart and labs.  Questions were answered to the patient's satisfaction.     YATES,MARK C

## 2015-05-04 NOTE — H&P (Signed)
[name] [name] is an 55 y.o. male.    The patient returns.  He is here with his wife, [name] [name].  He is having persistent problems with neck pain, shoulder pain, left greater than right.  Pain with rotation of his neck, grinding, pain with aching, pain with forward flexion.  Due to his pacemaker, CT scan was obtained, which shows spondylosis, C6-7, biforaminal narrowing, lateral recess narrowing, worse on the left than right, which appears moderately severe and is stable.  He does not have significant central stenosis.  Has essentially 1 level spondylitic changes.     PAST MEDICAL HISTORY:  Positive for hypertension.  He has pacemaker.  He thinks it is used maybe 70% of the time.     FAMILY HISTORY:  Positive for heart disease, diabetes, lung disease, unknown type of cancer.   SOCIAL HISTORY:  The patient quit smoking in 1982.  He is retired.  He does not drink.  Married to his wife, [name] [name].     REVIEW OF SYSTEMS:  A 14-point review of systems positive for acid reflux, heart disease, history of hypertension, occasional hypotension associated with bradycardia prior  to his pacemaker.     Past Medical History  Diagnosis Date  . Tachy-brady syndrome ([place])   . Vasovagal syncope     last one Jan 2017  . Syncopal episodes   . PONV (postoperative nausea and vomiting)   . Hypertension     has never been on meds  . Presence of permanent cardiac pacemaker   . COPD (chronic obstructive pulmonary disease) ([place])   . Shortness of breath dyspnea     with exertion  . History of bronchitis 20+ yrs ago  . Headache   . Weakness     numbness in left hand  . Arthritis   . Neck pain     buldging disc  . History of gout   . History of hiatal hernia   . Nocturia   . History of shingles     Past Surgical History  Procedure Laterality Date  . Cardiac electrophysiology study & dft    . Implantation of cardio loop    . Pacemaker insertion      [facility].Dr.Tony Rosita Fire is cardiologist  . Hernia repair    .  Removal of cardiac loop     . Cardiac electrophysiology mapping and ablation      Family History  Problem Relation Age of Onset  . Hypertension Neg Hx   . Diabetes Neg Hx   . Coronary artery disease Neg Hx    Social History:  reports that he has never smoked. He does not have any smokeless tobacco history on file. He reports that he uses illicit drugs (Marijuana). He reports that he does not drink alcohol.  Allergies: No Known Allergies  Medications Prior to Admission  Medication Sig Dispense Refill  . OVER THE COUNTER MEDICATION Take 0.5 each by mouth daily. The patient stated that he was told by his doctor to eat about a half handful of salt each day to maintain a healthy blood pressure since it was running low    . fludrocortisone (FLORINEF) 0.1 MG tablet Take 1 tablet (0.1 mg total) by mouth 2 (two) times daily. (Patient not taking: Reported on 05/02/2015) 60 tablet 11    Results for orders placed or performed during the hospital encounter of 05/02/15 (from the past 48 hour(s))  Surgical pcr screen     Status: None   Collection Time: 05/02/15  2:49  PM  Result Value Ref Range   MRSA, PCR NEGATIVE NEGATIVE   Staphylococcus aureus NEGATIVE NEGATIVE    Comment:        The Xpert SA Assay (FDA approved for NASAL specimens in patients over 56 years of age), is one component of a comprehensive surveillance program.  Test performance has been validated by [facility] for patients greater than or equal to 77 year old. It is not intended to diagnose infection nor to guide or monitor treatment.   APTT     Status: None   Collection Time: 05/02/15  2:50 PM  Result Value Ref Range   aPTT 31 24 - 37 seconds  CBC     Status: None   Collection Time: 05/02/15  2:50 PM  Result Value Ref Range   WBC 9.2 4.0 - 10.5 K/uL   RBC 5.00 4.22 - 5.81 MIL/uL   Hemoglobin 15.7 13.0 - 17.0 g/dL   HCT 46.3 39.0 - 52.0 %   MCV 92.6 78.0 - 100.0 fL   MCH 31.4 26.0 - 34.0 pg   MCHC 33.9 30.0 - 36.0  g/dL   RDW 12.7 11.5 - 15.5 %   Platelets 243 150 - 400 K/uL  Comprehensive metabolic panel     Status: Abnormal   Collection Time: 05/02/15  2:50 PM  Result Value Ref Range   Sodium 140 135 - 145 mmol/L   Potassium 3.8 3.5 - 5.1 mmol/L   Chloride 106 101 - 111 mmol/L   CO2 21 (L) 22 - 32 mmol/L   Glucose, Bld 91 65 - 99 mg/dL   BUN 16 6 - 20 mg/dL   Creatinine, Ser 1.03 0.61 - 1.24 mg/dL   Calcium 9.7 8.9 - 10.3 mg/dL   Total Protein 7.2 6.5 - 8.1 g/dL   Albumin 4.3 3.5 - 5.0 g/dL   AST 21 15 - 41 U/L   ALT 17 17 - 63 U/L   Alkaline Phosphatase 57 38 - 126 U/L   Total Bilirubin 0.6 0.3 - 1.2 mg/dL   GFR calc non Af Amer >60 >60 mL/min   GFR calc Af Amer >60 >60 mL/min    Comment: (NOTE) The eGFR has been calculated using the CKD EPI equation. This calculation has not been validated in all clinical situations. eGFR's persistently <60 mL/min signify possible Chronic Kidney Disease.    Anion gap 13 5 - 15  Protime-INR     Status: None   Collection Time: 05/02/15  2:50 PM  Result Value Ref Range   Prothrombin Time 13.6 11.6 - 15.2 seconds   INR 1.02 0.00 - 1.49  Urinalysis, Routine w reflex microscopic (not at [facility])     Status: None   Collection Time: 05/02/15  2:50 PM  Result Value Ref Range   Color, Urine YELLOW YELLOW   APPearance CLEAR CLEAR   Specific Gravity, Urine 1.005 1.005 - 1.030   pH 6.5 5.0 - 8.0   Glucose, UA NEGATIVE NEGATIVE mg/dL   Hgb urine dipstick NEGATIVE NEGATIVE   Bilirubin Urine NEGATIVE NEGATIVE   Ketones, ur NEGATIVE NEGATIVE mg/dL   Protein, ur NEGATIVE NEGATIVE mg/dL   Nitrite NEGATIVE NEGATIVE   Leukocytes, UA NEGATIVE NEGATIVE    Comment: MICROSCOPIC NOT DONE ON URINES WITH NEGATIVE PROTEIN, BLOOD, LEUKOCYTES, NITRITE, OR GLUCOSE <1000 mg/dL.   No results found.  Review of Systems  Constitutional: Negative.   Eyes: Negative.   Respiratory: Negative.   Genitourinary: Negative.   Musculoskeletal: Positive for neck pain.  Skin:  Negative.   Neurological: Negative.   Psychiatric/Behavioral: Negative.     Blood pressure 121/82, pulse 83, temperature 97.9 F (36.6 C), temperature source Oral, height _0  (1.753 m), weight 104.781 kg (231 lb), SpO2 97 %. Physical Exam  Constitutional: He is oriented to person, place, and time. No distress.  HENT:  Head: Atraumatic.  Eyes: EOM are normal.  Neck: Normal range of motion.  Cardiovascular: Normal rate.   Respiratory: No respiratory distress.  GI: He exhibits no distension.  Musculoskeletal: He exhibits tenderness.  Neurological: He is alert and oriented to person, place, and time.  Skin: Skin is warm and dry.  Psychiatric: He has a normal mood and affect.      PHYSICAL EXAMINATION:  The patient is alert, oriented.  He is 5 feet 9 inches, 215 pounds.  Extraocular movements intact.  Bilateral brachial plexus tenderness.  Biceps, triceps, wrist flexion/extension intact.  Positive Spurling on the left, negative on the right.  Negative Lhermitte.  No lower extremity hyperreflexia.  Palpable pacemaker.  No audible wheezing.  No JVD.  Thyroid not palpably enlarged.  No rash over exposed skin.  Skin over his neck is normal.     ASSESSMENT:  Cervical spondylosis.  He has been symptomatic for more than 3 years.  He states this bothers him significantly on a daily basis with pain that radiates in his shoulders, pain with rotation of his neck.  At times he has sharp pain, associated posterior headaches, and has to stop and either sit or rest when this flares.    PLAN:  We discussed options.  He would like to proceed with single-level cervical fusion.   The plan would be allograft plate, overnight stay at the hospital.  His cardiologist is Dr. Norville Haggard at [facility].  We will obtain preoperative clearance and then he can proceed with scheduling.   Lanae Crumbly, PA-C 05/04/2015, 11:11 AM

## 2015-05-04 NOTE — Progress Notes (Signed)
Orthopedic Tech Progress Note Patient Details:  Katherina RightDanny L Salts 01/04/1961 [id number]  Ortho Devices Type of Ortho Device: Soft collar Ortho Device/Splint Location: at bedside Ortho Device/Splint Interventions: Casandra DoffingOrdered   Sanskriti Greenlaw Craig 05/04/2015, 6:45 PM

## 2015-05-04 NOTE — Brief Op Note (Signed)
05/04/2015  2:16 PM  PATIENT:  [name] [name]  55 y.o. male  PRE-OPERATIVE DIAGNOSIS:  C6-7 Spondylosis  POST-OPERATIVE DIAGNOSIS:  C6-7 Spondylosis  PROCEDURE:  Procedure(s): C6-7 Anterior Cervical Discectomy and Fusion, Allograft, Plate  (N/A)  SURGEON:  Surgeon(s) and Role:    * Eldred MangesMark C Yates, MD - Primary  PHYSICIAN ASSISTANT: james m. Owens pa-c    ANESTHESIA:   general  EBL:  Total I/O In: 1000 [I.V.:1000] Out: -   BLOOD ADMINISTERED:none  DRAINS: hemovac  LOCAL MEDICATIONS USED:  NONE  SPECIMEN:  No Specimen  DISPOSITION OF SPECIMEN:  N/A  COUNTS:  YES  TOURNIQUET:  * No tourniquets in log *  DICTATION: .Dragon Dictation  PLAN OF CARE: Admit for overnight observation  PATIENT DISPOSITION:  PACU - hemodynamically stable.

## 2015-05-04 NOTE — Anesthesia Preprocedure Evaluation (Signed)
Anesthesia Evaluation  Patient identified by MRN, date of birth, ID band Patient awake    Reviewed: Allergy & Precautions, H&P , NPO status , Patient's Chart, lab work & pertinent test results  History of Anesthesia Complications (+) PONV  Airway Mallampati: II  TM Distance: >3 FB Neck ROM: Full    Dental no notable dental hx. (+) Teeth Intact, Dental Advisory Given   Pulmonary COPD,    Pulmonary exam normal breath sounds clear to auscultation       Cardiovascular hypertension, + pacemaker  Rhythm:Regular Rate:Normal     Neuro/Psych  Headaches, negative psych ROS   GI/Hepatic negative GI ROS, Neg liver ROS,   Endo/Other  negative endocrine ROS  Renal/GU negative Renal ROS  negative genitourinary   Musculoskeletal  (+) Arthritis , Osteoarthritis,    Abdominal   Peds  Hematology negative hematology ROS (+)   Anesthesia Other Findings   Reproductive/Obstetrics negative OB ROS                             Anesthesia Physical Anesthesia Plan  ASA: III  Anesthesia Plan: General   Post-op Pain Management:    Induction: Intravenous  Airway Management Planned: Oral ETT  Additional Equipment:   Intra-op Plan:   Post-operative Plan: Extubation in OR  Informed Consent: I have reviewed the patients History and Physical, chart, labs and discussed the procedure including the risks, benefits and alternatives for the proposed anesthesia with the patient or authorized representative who has indicated his/her understanding and acceptance.   Dental advisory given  Plan Discussed with: CRNA  Anesthesia Plan Comments:         Anesthesia Quick Evaluation

## 2015-05-04 NOTE — Anesthesia Procedure Notes (Signed)
Procedure Name: Intubation Date/Time: 05/04/2015 12:34 PM Performed by: [place] Pink Pre-anesthesia Checklist: Patient identified, Emergency Drugs available, Suction available, Patient being monitored and Timeout performed Patient Re-evaluated:Patient Re-evaluated prior to inductionOxygen Delivery Method: Circle system utilized Preoxygenation: Pre-oxygenation with 100% oxygen Intubation Type: IV induction Ventilation: Mask ventilation without difficulty Laryngoscope Size: Mac and 4 Grade View: Grade II Tube type: Oral Tube size: 7.5 mm Number of attempts: 1 Airway Equipment and Method: Stylet and LTA kit utilized Placement Confirmation: ETT inserted through vocal cords under direct vision,  positive ETCO2 and breath sounds checked- equal and bilateral Secured at: 22 cm Tube secured with: Tape Dental Injury: Teeth and Oropharynx as per pre-operative assessment

## 2015-05-04 NOTE — Op Note (Signed)
[name] [name]:  [name] [name]                 ACCOUNT NO.:  [id number]  MEDICAL RECORD NO.:  [id number]  LOCATION:  [id number]                        FACILITY:  [facility]  PHYSICIAN:  Mark C. Ophelia CharterYates, M.D.    DATE OF BIRTH:  11-06-1960  DATE OF PROCEDURE:  05/04/2015 DATE OF DISCHARGE:                              OPERATIVE REPORT   PREOPERATIVE DIAGNOSIS:  C6-7 cervical spondylosis.  POSTOPERATIVE DIAGNOSIS:  C6-7 cervical spondylosis.  PROCEDURE:  C6-7 anterior cervical diskectomy and fusion, allograft and plate.  SURGEON:  Mark C. Ophelia CharterYates, M.D.  ANESTHESIA:  General plus 6 mL Marcaine local.  ESTIMATED BLOOD LOSS:  Less than 100 mL.  IMPLANTS:  DePuy Skyline 12-mm plate, 16-XW14-mm screws x4.  Cortical cancellous allograft VG2 graft 7 mm lordotic.  DESCRIPTION OF PROCEDURE:  After induction of general anesthesia, standard prepping and draping, sterile Mayo stand at the head, usual sterile skin marker and Betadine Steri-Drape was applied.  Time-out procedure was completed.  Arms were tucked to the side with heel pads. After time-out and Ancef prophylaxis, incision was started at the midline extending to the left.  Platysma was divided in line with the skin incision.  Blunt dissection below the omohyoid down to the spur prominent at C6-7 was performed, and a short 25 needle straight clamp was placed into the C6-7 disk confirmed with lateral radiograph C-arm imaging.  Self-retaining retractors were placed after the appropriate disk was marked by taking a large chunk out of it with a scalpel and pituitary.  Teeth blades right and left underneath the longus colli. Smooth plates above and below.  Omohyoid was somewhat restrictive pushing the smooth retractors out of the way.  Diskectomy was performed using combination of Cloward bur and 2 mm rongeurs.  Uncovertebral joints were stripped.  Posterior longitudinal ligament was taken down. There was overhanging spurs with about a 0.5 mm disk space  posteriorly. Spurs were removed which were protruding into the canal.  Once the dura was decompressed and well visualized all the way across the back, trial sizer 7 gave a nice fit.  There was a small amount of epidural bleeding and some Surgiflo was mixed and inserted.  The field was appropriately dry.  Graft was inserted while CRNA pulled after trial sizing with 7-mm metal trials.  Graft was inserted with the graft holder, countersunk 2 mm and then 12 mm plate, placed with appropriate screws, checked under C- arm, confirmed position in both AP and lateral, then screws were locked in.  After irrigation, Hemovac was placed with in and out technique, platysma closed with 3-0 Vicryl, 4-0 Vicryl subcuticular closure, tincture of benzoin, Steri-Strips in the skin, Marcaine infiltration, 4x4s tape and soft cervical collar.  The patient tolerated the procedure well.  Transferred to recovery room in stable condition.     Mark C. Ophelia CharterYates, M.D.     MCY/MEDQ  D:  05/04/2015  T:  05/04/2015  Job:  [id number]

## 2015-05-05 ENCOUNTER — Encounter (HOSPITAL_COMMUNITY): Payer: Self-pay | Admitting: Orthopaedic Surgery

## 2015-05-05 DIAGNOSIS — M47812 Spondylosis without myelopathy or radiculopathy, cervical region: Secondary | ICD-10-CM | POA: Diagnosis present

## 2015-05-05 MED ORDER — METHOCARBAMOL 500 MG PO TABS: 500.0000 mg | ORAL_TABLET | Freq: Four times a day (QID) | ORAL | Status: DC | PRN

## 2015-05-05 MED ORDER — OXYCODONE-ACETAMINOPHEN 5-325 MG PO TABS: 1.0000 | ORAL_TABLET | Freq: Four times a day (QID) | ORAL | Status: DC | PRN

## 2015-05-05 NOTE — Progress Notes (Signed)
Orthopedic Tech Progress Note Patient Details:  Katherina RightDanny L Coad 02/15/1961 [id number]  Ortho Devices Type of Ortho Device: Soft collar Ortho Device/Splint Location: at bedside Ortho Device/Splint Interventions: Application   Saul FordyceJennifer C Jakaria Lavergne 05/05/2015, 12:54 PM

## 2015-05-05 NOTE — Clinical Social Work Note (Signed)
CSW received referral for SNF.  Case discussed with case manager and plan is to discharge home.  CSW to sign off please re-consult if social work needs arise.  [name] [name], MSW, LCSWA [phone]  

## 2015-05-05 NOTE — Progress Notes (Signed)
Subjective: Doing well this morning.  No dysphagia or difficulty breathing. Pain controlled.    Objective: Vital signs in last 24 hours: Temp:  [97.7 F (36.5 C)-97.9 F (36.6 C)] 97.8 F (36.6 C) (03/16 0645) Pulse Rate:  [45-84] 67 (03/16 0645) Resp:  [5-17] 16 (03/16 0645) BP: (100-129)/(65-83) 100/77 mmHg (03/16 0645) SpO2:  [94 %-100 %] 100 % (03/16 0645)  Intake/Output from previous day: 03/15 0701 - 03/16 0700 In: 1000 [I.V.:1000] Out: 840 [Urine:800; Blood:40] Intake/Output this shift: Total I/O In: 1277.5 [P.O.:240; I.V.:937.5; IV Piggyback:100] Out: 1050 [Urine:1050]   Recent Labs  05/02/15 1450  HGB 15.7    Recent Labs  05/02/15 1450  WBC 9.2  RBC 5.00  HCT 46.3  PLT 243    Recent Labs  05/02/15 1450  NA 140  K 3.8  CL 106  CO2 21*  BUN 16  CREATININE 1.03  GLUCOSE 91  CALCIUM 9.7    Recent Labs  05/02/15 1450  INR 1.02    Exam:  Alert and oriented.  Dr Ophelia Charteryates removed hemovac drain.  Wound looks good.  NVI.  No focal motor deficits.   Assessment/Plan: D/c home today after long hall ambulation.  F/u in office x one week. Scripts on chart for percocet and robaxin.    [name] [name] M 05/05/2015, 10:48 AM

## 2015-05-05 NOTE — Discharge Summary (Signed)
Patient ID: [name] [name] MRN: [id number] DOB/AGE: 53/06/1960 53 y.o.  Admit date: 05/04/2015 Discharge date: 05/05/2015  Admission Diagnoses:  Active Problems:   S/P cervical spinal fusion   Spondylosis, cervical   Discharge Diagnoses:  Active Problems:   S/P cervical spinal fusion   Spondylosis, cervical  status post Procedure(s): C6-7 Anterior Cervical Discectomy and Fusion, Allograft, Plate   Past Medical History  Diagnosis Date  . Tachy-brady syndrome (HCC)   . Vasovagal syncope     last one Jan 2017  . Syncopal episodes   . PONV (postoperative nausea and vomiting)   . Hypertension     has never been on meds  . Presence of permanent cardiac pacemaker   . COPD (chronic obstructive pulmonary disease) (HCC)   . Weakness     numbness in left hand  . Neck pain     buldging disc  . History of gout   . History of hiatal hernia   . Nocturia   . History of shingles   . Exertional shortness of breath   . Anginal pain (HCC)     "EVERY SINGLE DAY" (05/04/2015)  . GERD (gastroesophageal reflux disease)   . Daily headache     "hopefully this neck OR will help" (05/04/2015)  . Arthritis     "fingers; ankles" (05/04/2015)  . Depression     Surgeries: Procedure(s): C6-7 Anterior Cervical Discectomy and Fusion, Allograft, Plate  on 4/09/81193/15/2017   Consultants:    Discharged Condition: Improved  Hospital Course: [name] RightDanny L Mangino is an 55 y.o. male who was admitted 05/04/2015 for operative treatment of cervical stenosis. Patient failed conservative treatments (please see the history and physical for the specifics) and had severe unremitting pain that affects sleep, daily activities and work/hobbies. After pre-op clearance, the patient was taken to the operating room on 05/04/2015 and underwent  Procedure(s): C6-7 Anterior Cervical Discectomy and Fusion, Allograft, Plate .    Patient was given perioperative antibiotics: Anti-infectives    Start     Dose/Rate Route Frequency Ordered  Stop   05/04/15 2100  ceFAZolin (ANCEF) IVPB 1 g/50 mL premix     1 g 100 mL/hr over 30 Minutes Intravenous 3 times per day 05/04/15 1719 05/05/15 0639   05/04/15 1200  ceFAZolin (ANCEF) IVPB 2 g/50 mL premix     2 g 100 mL/hr over 30 Minutes Intravenous To ShortStay Surgical 05/03/15 0837 05/04/15 1243       Patient was given sequential compression devices and early ambulation to prevent DVT.   Patient benefited maximally from hospital stay and there were no complications. At the time of discharge, the patient was urinating/moving their bowels without difficulty, tolerating a regular diet, pain is controlled with oral pain medications and they have been cleared by PT/OT.   Recent vital signs: Patient Vitals for the past 24 hrs:  BP Temp Temp src Pulse Resp SpO2 Height Weight  05/05/15 0645 100/77 mmHg 97.8 F (36.6 C) Oral 67 16 100 % - -  05/05/15 0025 113/73 mmHg 97.8 F (36.6 C) Oral 71 16 100 % - -  05/04/15 2018 122/81 mmHg 97.7 F (36.5 C) Axillary 84 16 97 % - -  05/04/15 1713 117/76 mmHg 97.9 F (36.6 C) - 72 15 99 % - -  05/04/15 1645 106/65 mmHg - - 66 14 94 % - -  05/04/15 1630 102/74 mmHg 97.8 F (36.6 C) - 67 (!) 8 94 % - -  05/04/15 1615 116/66 mmHg - -  66 16 95 % - -  05/04/15 1600 114/73 mmHg - - 75 (!) 5 95 % - -  05/04/15 1545 109/70 mmHg - - 70 11 96 % - -  05/04/15 1530 125/69 mmHg - - 69 (!) 9 96 % - -  05/04/15 1515 127/73 mmHg - - 72 17 96 % - -  05/04/15 1500 129/81 mmHg - - 76 11 97 % - -  05/04/15 1445 124/78 mmHg - - 71 12 97 % - -  05/04/15 1430 121/83 mmHg 97.8 F (36.6 C) - (!) 45 16 97 % - -  05/04/15 1025 121/82 mmHg 97.9 F (36.6 C) Oral 83 - 97 %  (1.753 m) 104.781 kg (231 lb)     Recent laboratory studies:  Recent Labs  05/02/15 1450  WBC 9.2  HGB 15.7  HCT 46.3  PLT 243  NA 140  K 3.8  CL 106  CO2 21*  BUN 16  CREATININE 1.03  GLUCOSE 91  INR 1.02  CALCIUM 9.7     Discharge Medications:     Medication List     STOP taking these medications        OVER THE COUNTER MEDICATION      TAKE these medications        fludrocortisone 0.1 MG tablet  Commonly known as:  FLORINEF  Take 1 tablet (0.1 mg total) by mouth 2 (two) times daily.     methocarbamol 500 MG tablet  Commonly known as:  ROBAXIN  Take 1 tablet (500 mg total) by mouth every 6 (six) hours as needed for muscle spasms.     oxyCODONE-acetaminophen 5-325 MG tablet  Commonly known as:  PERCOCET/ROXICET  Take 1-2 tablets by mouth every 6 (six) hours as needed for moderate pain.        Diagnostic Studies: Dg Cervical Spine 2-3 Views  05/04/2015  CLINICAL DATA:  Cervical spine surgery. EXAM: CERVICAL SPINE - 2-3 VIEW; DG C-ARM 61-120 MIN COMPARISON:  Cervical spine CT 03/10/2015 FLUOROSCOPY TIME:  C-arm fluoroscopic images were obtained intraoperatively and submitted for post operative interpretation. Please see the performing provider's procedural report for the fluoroscopy time utilized. FINDINGS: Four intraoperative spot fluoroscopic images of the cervical spine are provided, 3 lateral and 1 frontal. The initial image demonstrates localization of the C6 level. Subsequent images demonstrate interval performance of C6-7 ACDF. An endotracheal tube is in place. IMPRESSION: Intraoperative images during C6-7 ACDF. Electronically Signed   By: Sebastian Ache M.D.   On: 05/04/2015 14:48   Dg C-arm 1-60 Min  05/04/2015  CLINICAL DATA:  Cervical spine surgery. EXAM: CERVICAL SPINE - 2-3 VIEW; DG C-ARM 61-120 MIN COMPARISON:  Cervical spine CT 03/10/2015 FLUOROSCOPY TIME:  C-arm fluoroscopic images were obtained intraoperatively and submitted for post operative interpretation. Please see the performing provider's procedural report for the fluoroscopy time utilized. FINDINGS: Four intraoperative spot fluoroscopic images of the cervical spine are provided, 3 lateral and 1 frontal. The initial image demonstrates localization of the C6 level. Subsequent images  demonstrate interval performance of C6-7 ACDF. An endotracheal tube is in place. IMPRESSION: Intraoperative images during C6-7 ACDF. Electronically Signed   By: Sebastian Ache M.D.   On: 05/04/2015 14:48        Discharge Instructions    Call MD / Call 911    Complete by:  As directed   If you experience chest pain or shortness of breath, CALL 911 and be transported to the hospital emergency room.  If  you develope a fever above 101 F, pus (white drainage) or increased drainage or redness at the wound, or calf pain, call your surgeon's office.     Constipation Prevention    Complete by:  As directed   Drink plenty of fluids.  Prune juice may be helpful.  You may use a stool softener, such as Colace (over the counter) 100 mg twice a day.  Use MiraLax (over the counter) for constipation as needed.     Diet - low sodium heart healthy    Complete by:  As directed      Discharge instructions    Complete by:  As directed   Ok to shower 5 days postop.  Do not apply any creams or ointments to incision.  Do not remove steri-strips.  Can use 4x4 gauze and tape for dressing changes.  No aggressive activity. Cervical collar must be on at all times even when showering.  Do not bend or turn neck.     Driving restrictions    Complete by:  As directed   No driving     Increase activity slowly as tolerated    Complete by:  As directed      Lifting restrictions    Complete by:  As directed   No lifting           Follow-up Information    Schedule an appointment as soon as possible for a visit with Eldred Manges, MD.   Specialty:  Orthopedic Surgery   Why:  need return office visit one week postop   Contact information:   [address] [place] [address] [phone]       Discharge Plan:  discharge to home  Disposition:     Signed: Naida Sleight for Annell Greening MD 05/05/2015, 8:22 AM

## 2015-05-05 NOTE — Progress Notes (Signed)
Patient is discharged from room 5N15 at this time. Alert and in stable condition. IV site d/c'd. Soft cervical collar on and aligned. instructions read to patient and wife with understanding verbalized. Left unit via wheelchair with family and all belongings at side.

## 2015-05-06 NOTE — Anesthesia Postprocedure Evaluation (Signed)
Anesthesia Post Note  Patient: [name] [name]  Procedure(s) Performed: Procedure(s) (LRB): C6-7 Anterior Cervical Discectomy and Fusion, Allograft, Plate  (N/A)  Patient location during evaluation: PACU Anesthesia Type: General Level of consciousness: awake and alert Pain management: pain level controlled Vital Signs Assessment: post-procedure vital signs reviewed and stable Respiratory status: spontaneous breathing, nonlabored ventilation, respiratory function stable and patient connected to nasal cannula oxygen Cardiovascular status: blood pressure returned to baseline and stable Postop Assessment: no signs of nausea or vomiting Anesthetic complications: no    Last Vitals:  Filed Vitals:   05/05/15 0025 05/05/15 0645  BP: 113/73 100/77  Pulse: 71 67  Temp: 36.6 C 36.6 C  Resp: 16 16    Last Pain:  Filed Vitals:   05/05/15 1156  PainSc: 3                  Phillips Groutarignan, Desree Leap

## 2016-03-01 ENCOUNTER — Ambulatory Visit (INDEPENDENT_AMBULATORY_CARE_PROVIDER_SITE_OTHER): Payer: Medicare HMO | Admitting: Orthopaedic Surgery

## 2016-03-01 ENCOUNTER — Encounter (INDEPENDENT_AMBULATORY_CARE_PROVIDER_SITE_OTHER): Payer: Self-pay | Admitting: Orthopaedic Surgery

## 2016-03-01 VITALS — BP 140/71 | HR 67 | Ht 69.0 in | Wt 215.0 lb

## 2016-03-01 DIAGNOSIS — S129XXA Fracture of neck, unspecified, initial encounter: Secondary | ICD-10-CM | POA: Diagnosis not present

## 2016-03-01 NOTE — Progress Notes (Signed)
Office Visit Note   Patient: [name] [name]           Date of Birth: 11/28/1960           MRN: [id number] Visit Date: 03/01/2016              Requested by: No referring provider defined for this encounter. PCP: No PCP Per Patient   Assessment & Plan: Visit Diagnoses:  1. Cervical pseudoarthrosis, initial encounter (HCC)           Cervical fusion C6-7 on 05/04/2015.  Plan: Patient has a cervical pseudoarthrosis after 10 months post C6-7 fusion with failure incorporation at the inferior aspect of the graft. We discussed options for treatment at this point. He is symptomatic and has failed conservative treatment over more than 3 months. I'll recommend a posterior cervical fusion with wiring iliac aspirate, Vitoss,  Overnight admission (Medicare required admission procedure) stay in the hospital. He need use a collar for a few weeks. Possible foraminotomy at the time of the cervical fusion on the left side was more symptomatic. Use of triple strand wire discussed. He understands and requests we proceed.  Follow-Up Instructions: No Follow-up on file.   Orders:  No orders of the defined types were placed in this encounter.  No orders of the defined types were placed in this encounter.     Procedures: No procedures performed   Clinical Data: No additional findings.   CT cervical   02/28/2016   Subjective: Chief Complaint  Patient presents with  . Neck - Pain    Patient presents with left neck pain that radiates down left arm.  He states that he has decreased grip and tingling in his left upper extremity. He has had no known injury.  He woke up with sharp pain and popping. He has tried flexeril, prednisone, heat, and hot showers. He has had previous C6-7 ACDF on 05/04/2015. He was seen at [facility] with a Ct.   Cervical CT scan was performed in January. (Patient has a pacemaker). This shows incorporation of the upper portion of the graft with the bottom portion of the graft  was not incorporated into C7 and has subsided in through the endplate. A lucent line is seen across all slices. There is mild to moderate left foraminal narrowing. Patient denies fever chills no lower extremity weakness. Pain radiates from his neck into his left shoulder. Exercises and prednisone pack, anti-inflammatories and muscle relaxants have not helped his symptoms. He was referred back to me after findings on the CT scan. Review of Systems  Constitutional: Negative for chills and diaphoresis.  HENT: Negative for ear discharge, ear pain and nosebleeds.   Eyes: Negative for discharge and visual disturbance.  Respiratory: Negative for cough, choking and shortness of breath.   Cardiovascular: Negative for chest pain and palpitations.       Pacemaker with history of vasovagal syncope his cardiologist is Dr. Luella Cook at [facility] cardiology department  Gastrointestinal: Negative for abdominal distention and abdominal pain.  Endocrine: Negative for cold intolerance and heat intolerance.  Genitourinary: Negative for flank pain and hematuria.  Musculoskeletal:       Chronic neck pain that bothers him at night elderly with soft cervical collar and anti-inflammatory medications. His symptoms have gradually progressed over the last 6 months  Skin: Negative for rash and wound.  Neurological: Negative for seizures and speech difficulty.  Hematological: Negative for adenopathy. Does not bruise/bleed easily.  Psychiatric/Behavioral: Negative for agitation  and suicidal ideas.     Objective: Vital Signs: BP 140/71   Pulse 67   Ht 5\' 9"  (1.753 m)   Wt 215 lb (97.5 kg)   BMI 31.75 kg/m   Physical Exam  Constitutional: He is oriented to person, place, and time. He appears well-developed and well-nourished.  HENT:  Head: Normocephalic and atraumatic.  Eyes: EOM are normal. Pupils are equal, round, and reactive to light.  Neck: No tracheal deviation present. No thyromegaly present.    Cardiovascular: Normal rate.   Patient has a pacemaker.  Pulmonary/Chest: Effort normal. He has no wheezes.  Abdominal: Soft. Bowel sounds are normal.  Musculoskeletal:  Patient's 50% rotation left and right with endpoint pain. Brachioplexus tenderness worse on the left than right side. Reflexes are 2+ and symmetrical sensation is hand is normal no evidence of peripheral nerve compression ulnar nerve at the elbow median nerve of the forearm or wrist. Positive Spurling on the left negative on the right. Lower extremity reflexes are 2+. No supraclavicular lymphadenopathy. Pain with cervical compression.  Neurological: He is alert and oriented to person, place, and time.  Skin: Skin is warm and dry. Capillary refill takes less than 2 seconds.  Psychiatric: He has a normal mood and affect. His behavior is normal. Judgment and thought content normal.    Ortho Exam cessation the fingertips are normal. Normal 2 point. No isolated motor weakness biceps triceps shoulder internal/external rotation. Normal shrug. Well-healed anterior cervical incision.  Specialty Comments:  No specialty comments available.  Imaging: No results found.   PMFS History: Patient Active Problem List   Diagnosis Date Noted  . Spondylosis, cervical 05/05/2015  . S/P cervical spinal fusion 05/04/2015  . NONSPECIFIC ABNORMAL UNSPEC CV FUNCTION STUDY 04/06/2009  . CHEST PAIN UNSPECIFIED 01/20/2009  . Syncope and collapse 01/12/2009   Past Medical History:  Diagnosis Date  . Anginal pain (HCC)    "EVERY SINGLE DAY" (05/04/2015)  . Arthritis    "fingers; ankles" (05/04/2015)  . COPD (chronic obstructive pulmonary disease) (HCC)   . Daily headache    "hopefully this neck OR will help" (05/04/2015)  . Depression   . Exertional shortness of breath   . GERD (gastroesophageal reflux disease)   . History of gout   . History of hiatal hernia   . History of shingles   . Hypertension    has never been on meds  . Neck  pain    buldging disc  . Nocturia   . PONV (postoperative nausea and vomiting)   . Presence of permanent cardiac pacemaker   . Syncopal episodes   . Tachy-brady syndrome (HCC)   . Vasovagal syncope    last one Jan 2017  . Weakness    numbness in left hand    Family History  Problem Relation Age of Onset  . Hypertension Neg Hx   . Diabetes Neg Hx   . Coronary artery disease Neg Hx     Past Surgical History:  Procedure Laterality Date  . ANTERIOR CERVICAL DECOMP/DISCECTOMY FUSION  05/04/2015  . ANTERIOR CERVICAL DECOMP/DISCECTOMY FUSION N/A 05/04/2015   Procedure: C6-7 Anterior Cervical Discectomy and Fusion, Allograft, Plate ;  Surgeon: Eldred Manges, MD;  Location: [facility] OR;  Service: Orthopedics;  Laterality: N/A;  . BACK SURGERY    . CARDIAC ELECTROPHYSIOLOGY MAPPING AND ABLATION    . CARDIAC ELECTROPHYSIOLOGY STUDY & DFT    . HERNIA REPAIR    . INSERT / REPLACE / REMOVE PACEMAKER  [facility].Dr.Tony Sharol HarnessSimmons is cardiologist  . LOOP RECORDER INSERTION  07/1999   [name]/notes 07/05/2010  . LOOP RECORDER REMOVAL  07/2001   [name]/notes 07/05/2010  . SHOULDER ARTHROSCOPY W/ ROTATOR CUFF REPAIR Right   . UMBILICAL HERNIA REPAIR     Social History   Occupational History  . Full time    Social History Main Topics  . Smoking status: Former Smoker    Packs/day: 0.12    Years: 2.00    Types: Cigarettes  . Smokeless tobacco: Never Used     Comment: quit smoking in 1981  . Alcohol use No  . Drug use:     Types: Marijuana     Comment: 05/04/2015 " occasionally last time 05/01/15"  . Sexual activity: Yes

## 2016-03-26 ENCOUNTER — Other Ambulatory Visit (INDEPENDENT_AMBULATORY_CARE_PROVIDER_SITE_OTHER): Payer: Self-pay | Admitting: Orthopaedic Surgery

## 2016-03-26 DIAGNOSIS — M960 Pseudarthrosis after fusion or arthrodesis: Secondary | ICD-10-CM

## 2016-04-10 NOTE — Pre-Procedure Instructions (Signed)
[name] [name]  04/10/2016      [facility] [address] - [place], [place] - [address] [place] [address] Phone: [phone] Fax: [phone]  [id number] - [place], [place] - [address] [place] [address] Phone: [phone] Fax: [phone]    Your procedure is scheduled on Wednesday February 28.  [facility] at 10:30 A.M.  Call this number if you have problems the morning of surgery:  [phone]   Remember:  Do not eat food or drink liquids after midnight.  Take these medicines the morning of surgery with A SIP OF WATER: propranolol (Inderal), escitalopram (Lexapro), oxycodone (percoect)  7 days prior to surgery STOP taking any Aspirin, Aleve, Naproxen, Ibuprofen, Motrin, Advil, Goody's, BC's, all herbal medications, fish oil, and all vitamins    Do not wear jewelry, make-up or nail polish.  Do not wear lotions, powders, or colognes, or deoderant.   Men may shave face and neck.  Do not bring valuables to the hospital.  [facility] is not responsible for any belongings or valuables.  Contacts, dentures or bridgework may not be worn into surgery.  Leave your suitcase in the car.  After surgery it may be brought to your room.  For patients admitted to the hospital, discharge time will be determined by your treatment team.  Patients discharged the day of surgery will not be allowed to drive home.    Special instructions:    [facility]- Preparing For Surgery  Before surgery, you can play an important role. Because skin is not sterile, your skin needs to be as free of germs as possible. You can reduce the number of germs on your skin by washing with CHG (chlorahexidine gluconate) Soap before surgery.  CHG is an antiseptic cleaner which kills germs and bonds with the skin to continue killing germs even after washing.  Please do not use if you have an allergy to CHG or  antibacterial soaps. If your skin becomes reddened/irritated stop using the CHG.  Do not shave (including legs and underarms) for at least 48 hours prior to first CHG shower. It is OK to shave your face.  Please follow these instructions carefully.   1. Shower the NIGHT BEFORE SURGERY and the MORNING OF SURGERY with CHG.   2. If you chose to wash your hair, wash your hair first as usual with your normal shampoo.  3. After you shampoo, rinse your hair and body thoroughly to remove the shampoo.  4. Use CHG as you would any other liquid soap. You can apply CHG directly to the skin and wash gently with a scrungie or a clean washcloth.   5. Apply the CHG Soap to your body ONLY FROM THE NECK DOWN.  Do not use on open wounds or open sores. Avoid contact with your eyes, ears, mouth and genitals (private parts). Wash genitals (private parts) with your normal soap.  6. Wash thoroughly, paying special attention to the area where your surgery will be performed.  7. Thoroughly rinse your body with warm water from the neck down.  8. DO NOT shower/wash with your normal soap after using and rinsing off the CHG Soap.  9. Pat yourself dry with a CLEAN TOWEL.   10. Wear CLEAN PAJAMAS   11. Place CLEAN SHEETS on your bed the night of your first shower and DO NOT SLEEP WITH PETS.    Day of Surgery:  Do not apply any deodorants/lotions. Please wear clean clothes to the hospital/surgery center.      Please read over the following fact sheets that you were given. MRSA Information

## 2016-04-11 ENCOUNTER — Other Ambulatory Visit: Payer: Self-pay

## 2016-04-11 ENCOUNTER — Encounter (HOSPITAL_COMMUNITY)
Admission: RE | Admit: 2016-04-11 | Discharge: 2016-04-11 | Disposition: A | Discharge status: Home / Self Care | Payer: Medicare HMO | Source: Ambulatory Visit | Attending: Orthopaedic Surgery | Admitting: Orthopaedic Surgery

## 2016-04-11 ENCOUNTER — Encounter (HOSPITAL_COMMUNITY): Payer: Self-pay

## 2016-04-11 DIAGNOSIS — Z01818 Encounter for other preprocedural examination: Secondary | ICD-10-CM | POA: Diagnosis present

## 2016-04-11 HISTORY — DX: Dyspnea, unspecified: R06.00

## 2016-04-11 LAB — SURGICAL PCR SCREEN
MRSA, PCR: NEGATIVE
STAPHYLOCOCCUS AUREUS: NEGATIVE

## 2016-04-11 LAB — COMPREHENSIVE METABOLIC PANEL
ALBUMIN: 4 g/dL (ref 3.5–5.0)
ALT: 13 U/L — AB (ref 17–63)
AST: 18 U/L (ref 15–41)
Alkaline Phosphatase: 59 U/L (ref 38–126)
Anion gap: 9 (ref 5–15)
BUN: 16 mg/dL (ref 6–20)
CHLORIDE: 108 mmol/L (ref 101–111)
CO2: 23 mmol/L (ref 22–32)
CREATININE: 1.08 mg/dL (ref 0.61–1.24)
Calcium: 9.3 mg/dL (ref 8.9–10.3)
GFR calc Af Amer: >60 mL/min (ref >60)
Glucose, Bld: 97 mg/dL (ref 65–99)
POTASSIUM: 3.5 mmol/L (ref 3.5–5.1)
SODIUM: 140 mmol/L (ref 135–145)
Total Bilirubin: 0.6 mg/dL (ref 0.3–1.2)
Total Protein: 7.1 g/dL (ref 6.5–8.1)

## 2016-04-11 LAB — CBC
HCT: 43.3 % (ref 39.0–52.0)
Hemoglobin: 14.6 g/dL (ref 13.0–17.0)
MCH: 31.7 pg (ref 26.0–34.0)
MCHC: 33.7 g/dL (ref 30.0–36.0)
MCV: 94.1 fL (ref 78.0–100.0)
PLATELETS: 251 10*3/uL (ref 150–400)
RBC: 4.6 MIL/uL (ref 4.22–5.81)
RDW: 13.1 % (ref 11.5–15.5)
WBC: 10.3 10*3/uL (ref 4.0–10.5)

## 2016-04-11 LAB — ABO/RH: ABO/RH(D): O NEG

## 2016-04-11 LAB — TYPE AND SCREEN
ABO/RH(D): O NEG
ANTIBODY SCREEN: NEGATIVE
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
## 2016-04-11 NOTE — Progress Notes (Signed)
PCP: Samuel Jesterynthia Butler Cardiologist[facility] Stress test: 2012 Cath: 2014  Pt reports having syncopal episodes in past, but not having passed out since PPM placement. Pt reports frequent chest pain that last a few seconds to a minute, aching and throbbing. Last episode this AM around 8:00. No chest pain at this moment.   Pt has biotronic PPM, form faxed to Dr. Sharol HarnessSimmons. No reply yet, Biotronic rep notified of surgical date, states to call when form received with instructions and he will try to contact Dr. Sharol HarnessSimmons as well.   EKG: 04/11/16 Anesthesia notified and to come see patient at PAT.

## 2016-04-17 NOTE — Progress Notes (Signed)
Re-faxed device reprogramming form to Dr[facility]mons at [facility]--awaiting return instructions.  Due to pt's position during surgery, called Kipp BroodBrent, Biotronik rep, and verified his need to be at Short Stay prior to pt's surgery start time of 12:30.  Kipp BroodBrent stated he was aware and would be available to see pt.

## 2016-04-20 ENCOUNTER — Observation Stay (HOSPITAL_COMMUNITY)
Admission: RE | Admit: 2016-04-20 | Discharge: 2016-04-22 | Disposition: A | Discharge status: Home / Self Care | Payer: Medicare HMO | Source: Ambulatory Visit | Attending: Orthopaedic Surgery | Admitting: Orthopaedic Surgery

## 2016-04-20 ENCOUNTER — Inpatient Hospital Stay (HOSPITAL_COMMUNITY): Payer: Medicare HMO | Admitting: Certified Registered Nurse Anesthetist

## 2016-04-20 ENCOUNTER — Encounter (HOSPITAL_COMMUNITY): Payer: Self-pay | Admitting: *Deleted

## 2016-04-20 ENCOUNTER — Inpatient Hospital Stay (HOSPITAL_COMMUNITY): Payer: Medicare HMO | Admitting: Vascular Surgery

## 2016-04-20 ENCOUNTER — Encounter (HOSPITAL_COMMUNITY)
Admission: RE | Disposition: A | Discharge status: Home / Self Care | Payer: Self-pay | Source: Ambulatory Visit | Attending: Orthopaedic Surgery

## 2016-04-20 ENCOUNTER — Inpatient Hospital Stay (HOSPITAL_COMMUNITY): Payer: Medicare HMO

## 2016-04-20 DIAGNOSIS — M960 Pseudarthrosis after fusion or arthrodesis: Secondary | ICD-10-CM | POA: Diagnosis not present

## 2016-04-20 DIAGNOSIS — I10 Essential (primary) hypertension: Secondary | ICD-10-CM | POA: Insufficient documentation

## 2016-04-20 DIAGNOSIS — J449 Chronic obstructive pulmonary disease, unspecified: Secondary | ICD-10-CM | POA: Insufficient documentation

## 2016-04-20 DIAGNOSIS — S129XXD Fracture of neck, unspecified, subsequent encounter: Secondary | ICD-10-CM | POA: Diagnosis not present

## 2016-04-20 DIAGNOSIS — K219 Gastro-esophageal reflux disease without esophagitis: Secondary | ICD-10-CM | POA: Insufficient documentation

## 2016-04-20 DIAGNOSIS — Z950 Presence of cardiac pacemaker: Secondary | ICD-10-CM | POA: Diagnosis not present

## 2016-04-20 DIAGNOSIS — F329 Major depressive disorder, single episode, unspecified: Secondary | ICD-10-CM | POA: Insufficient documentation

## 2016-04-20 DIAGNOSIS — M1990 Unspecified osteoarthritis, unspecified site: Secondary | ICD-10-CM | POA: Insufficient documentation

## 2016-04-20 DIAGNOSIS — Z87891 Personal history of nicotine dependence: Secondary | ICD-10-CM | POA: Diagnosis not present

## 2016-04-20 DIAGNOSIS — S129XXA Fracture of neck, unspecified, initial encounter: Secondary | ICD-10-CM | POA: Diagnosis present

## 2016-04-20 DIAGNOSIS — Z419 Encounter for procedure for purposes other than remedying health state, unspecified: Secondary | ICD-10-CM

## 2016-04-20 HISTORY — PX: POSTERIOR CERVICAL FUSION/FORAMINOTOMY: SHX5038

## 2016-04-20 SURGERY — POSTERIOR CERVICAL FUSION/FORAMINOTOMY LEVEL 1
Anesthesia: General | Site: Neck

## 2016-04-20 MED ORDER — DOCUSATE SODIUM 100 MG PO CAPS
100.0000 mg | ORAL_CAPSULE | Freq: Two times a day (BID) | ORAL | Status: DC
  Administered 2016-04-20 – 2016-04-21 (×3): 100 mg via ORAL
  Filled 2016-04-20 (×3): qty 1

## 2016-04-20 MED ORDER — ROCURONIUM BROMIDE 50 MG/5ML IV SOSY
PREFILLED_SYRINGE | INTRAVENOUS | Status: AC
  Filled 2016-04-20: qty 15

## 2016-04-20 MED ORDER — FENTANYL CITRATE (PF) 100 MCG/2ML IJ SOLN
INTRAMUSCULAR | Status: DC | PRN
  Administered 2016-04-20: 150 ug via INTRAVENOUS

## 2016-04-20 MED ORDER — MENTHOL 3 MG MT LOZG: 1.0000 | LOZENGE | OROMUCOSAL | Status: DC | PRN

## 2016-04-20 MED ORDER — ESCITALOPRAM OXALATE 10 MG PO TABS
10.0000 mg | ORAL_TABLET | Freq: Every day | ORAL | Status: DC
  Administered 2016-04-21: 10 mg via ORAL
  Filled 2016-04-20: qty 1

## 2016-04-20 MED ORDER — SUGAMMADEX SODIUM 200 MG/2ML IV SOLN
INTRAVENOUS | Status: DC | PRN
  Administered 2016-04-20: 200 mg via INTRAVENOUS

## 2016-04-20 MED ORDER — ONDANSETRON HCL 4 MG/2ML IJ SOLN
4.0000 mg | Freq: Four times a day (QID) | INTRAMUSCULAR | Status: DC | PRN
  Administered 2016-04-20 – 2016-04-21 (×4): 4 mg via INTRAVENOUS
  Filled 2016-04-20 (×3): qty 2

## 2016-04-20 MED ORDER — SUGAMMADEX SODIUM 200 MG/2ML IV SOLN
INTRAVENOUS | Status: AC
  Filled 2016-04-20: qty 2

## 2016-04-20 MED ORDER — METHOCARBAMOL 1000 MG/10ML IJ SOLN
500.0000 mg | INTRAVENOUS | Status: AC
  Administered 2016-04-20: 500 mg via INTRAVENOUS
  Filled 2016-04-20: qty 5

## 2016-04-20 MED ORDER — SODIUM CHLORIDE 0.9 % IV SOLN: 250.0000 mL | INTRAVENOUS | Status: DC

## 2016-04-20 MED ORDER — ONDANSETRON HCL 4 MG/2ML IJ SOLN
INTRAMUSCULAR | Status: AC
  Filled 2016-04-20: qty 2

## 2016-04-20 MED ORDER — CHLORHEXIDINE GLUCONATE 4 % EX LIQD: 60.0000 mL | Freq: Once | CUTANEOUS | Status: DC

## 2016-04-20 MED ORDER — PROPRANOLOL HCL 10 MG PO TABS
10.0000 mg | ORAL_TABLET | Freq: Once | ORAL | Status: AC
  Administered 2016-04-20: 10 mg via ORAL
  Filled 2016-04-20: qty 1

## 2016-04-20 MED ORDER — PROMETHAZINE HCL 25 MG/ML IJ SOLN
6.2500 mg | Freq: Once | INTRAMUSCULAR | Status: AC
  Administered 2016-04-20: 6.25 mg via INTRAVENOUS

## 2016-04-20 MED ORDER — PHENYLEPHRINE 40 MCG/ML (10ML) SYRINGE FOR IV PUSH (FOR BLOOD PRESSURE SUPPORT)
PREFILLED_SYRINGE | INTRAVENOUS | Status: AC
  Filled 2016-04-20: qty 10

## 2016-04-20 MED ORDER — PHENYLEPHRINE HCL 10 MG/ML IJ SOLN
INTRAVENOUS | Status: DC | PRN
  Administered 2016-04-20: 10 ug/min via INTRAVENOUS

## 2016-04-20 MED ORDER — BUPIVACAINE-EPINEPHRINE (PF) 0.5% -1:200000 IJ SOLN
INTRAMUSCULAR | Status: AC
  Filled 2016-04-20: qty 30

## 2016-04-20 MED ORDER — FENTANYL CITRATE (PF) 100 MCG/2ML IJ SOLN
INTRAMUSCULAR | Status: AC
  Filled 2016-04-20: qty 4

## 2016-04-20 MED ORDER — SODIUM CHLORIDE 0.9% FLUSH: 3.0000 mL | Freq: Two times a day (BID) | INTRAVENOUS | Status: DC

## 2016-04-20 MED ORDER — MIDAZOLAM HCL 5 MG/5ML IJ SOLN
INTRAMUSCULAR | Status: DC | PRN
  Administered 2016-04-20: 2 mg via INTRAVENOUS

## 2016-04-20 MED ORDER — FAMOTIDINE 20 MG PO TABS
10.0000 mg | ORAL_TABLET | Freq: Every day | ORAL | Status: DC
  Administered 2016-04-21: 10 mg via ORAL
  Filled 2016-04-20: qty 1

## 2016-04-20 MED ORDER — LACTATED RINGERS IV SOLN
INTRAVENOUS | Status: DC
  Administered 2016-04-20 (×2): via INTRAVENOUS

## 2016-04-20 MED ORDER — EPHEDRINE 5 MG/ML INJ
INTRAVENOUS | Status: AC
  Filled 2016-04-20: qty 10

## 2016-04-20 MED ORDER — VANCOMYCIN HCL 500 MG IV SOLR
INTRAVENOUS | Status: AC
  Filled 2016-04-20: qty 500

## 2016-04-20 MED ORDER — ONDANSETRON HCL 4 MG/2ML IJ SOLN
INTRAMUSCULAR | Status: DC | PRN
  Administered 2016-04-20: 4 mg via INTRAVENOUS

## 2016-04-20 MED ORDER — MIDAZOLAM HCL 2 MG/2ML IJ SOLN
INTRAMUSCULAR | Status: AC
  Filled 2016-04-20: qty 2

## 2016-04-20 MED ORDER — BUPIVACAINE HCL (PF) 0.5 % IJ SOLN
INTRAMUSCULAR | Status: AC
  Filled 2016-04-20: qty 30

## 2016-04-20 MED ORDER — POLYETHYLENE GLYCOL 3350 17 G PO PACK
17.0000 g | PACK | Freq: Every day | ORAL | Status: DC
  Administered 2016-04-21: 17 g via ORAL
  Filled 2016-04-20: qty 1

## 2016-04-20 MED ORDER — METHOCARBAMOL 500 MG PO TABS: 500.0000 mg | ORAL_TABLET | Freq: Four times a day (QID) | ORAL | 0 refills | Status: DC

## 2016-04-20 MED ORDER — PHENOL 1.4 % MT LIQD
1.0000 | OROMUCOSAL | Status: DC | PRN
Start: 2016-04-20 — End: 2016-04-22

## 2016-04-20 MED ORDER — LIDOCAINE HCL (CARDIAC) 20 MG/ML IV SOLN
INTRAVENOUS | Status: DC | PRN
Start: 2016-04-20 — End: 2016-04-20
  Administered 2016-04-20: 100 mg via INTRAVENOUS

## 2016-04-20 MED ORDER — BUPIVACAINE-EPINEPHRINE 0.5% -1:200000 IJ SOLN: INTRAMUSCULAR | Status: DC | PRN

## 2016-04-20 MED ORDER — BUPIVACAINE LIPOSOME 1.3 % IJ SUSP
20.0000 mL | INTRAMUSCULAR | Status: AC
  Administered 2016-04-20: 20 mL
  Filled 2016-04-20: qty 20

## 2016-04-20 MED ORDER — PROPOFOL 10 MG/ML IV BOLUS
INTRAVENOUS | Status: AC
  Filled 2016-04-20: qty 20

## 2016-04-20 MED ORDER — PROPRANOLOL HCL 20 MG PO TABS
10.0000 mg | ORAL_TABLET | Freq: Three times a day (TID) | ORAL | Status: DC
  Administered 2016-04-20 – 2016-04-21 (×3): 10 mg via ORAL
  Filled 2016-04-20 (×3): qty 1

## 2016-04-20 MED ORDER — PROPOFOL 10 MG/ML IV BOLUS
INTRAVENOUS | Status: DC | PRN
  Administered 2016-04-20: 100 mg via INTRAVENOUS

## 2016-04-20 MED ORDER — CEFAZOLIN SODIUM-DEXTROSE 2-4 GM/100ML-% IV SOLN
2.0000 g | INTRAVENOUS | Status: AC
  Administered 2016-04-20: 2 g via INTRAVENOUS
  Filled 2016-04-20: qty 100

## 2016-04-20 MED ORDER — HYDROMORPHONE HCL 1 MG/ML IJ SOLN
INTRAMUSCULAR | Status: AC
  Filled 2016-04-20: qty 0.5

## 2016-04-20 MED ORDER — DEXAMETHASONE SODIUM PHOSPHATE 10 MG/ML IJ SOLN
INTRAMUSCULAR | Status: AC
  Filled 2016-04-20: qty 1

## 2016-04-20 MED ORDER — METHOCARBAMOL 500 MG PO TABS
500.0000 mg | ORAL_TABLET | Freq: Four times a day (QID) | ORAL | Status: DC | PRN
  Administered 2016-04-20 – 2016-04-22 (×4): 500 mg via ORAL
  Filled 2016-04-20 (×4): qty 1

## 2016-04-20 MED ORDER — SODIUM CHLORIDE 0.9% FLUSH: 3.0000 mL | INTRAVENOUS | Status: DC | PRN

## 2016-04-20 MED ORDER — PROMETHAZINE HCL 25 MG/ML IJ SOLN
INTRAMUSCULAR | Status: AC
  Administered 2016-04-20: 6.25 mg via INTRAVENOUS
  Filled 2016-04-20: qty 1

## 2016-04-20 MED ORDER — BUPIVACAINE HCL (PF) 0.5 % IJ SOLN
INTRAMUSCULAR | Status: DC | PRN
  Administered 2016-04-20: 10 mL

## 2016-04-20 MED ORDER — OXAZEPAM 15 MG PO CAPS: 30.0000 mg | ORAL_CAPSULE | Freq: Every day | ORAL | Status: DC

## 2016-04-20 MED ORDER — HYDROMORPHONE HCL 1 MG/ML IJ SOLN
0.2500 mg | INTRAMUSCULAR | Status: DC | PRN
  Administered 2016-04-20 (×2): 0.5 mg via INTRAVENOUS

## 2016-04-20 MED ORDER — SODIUM CHLORIDE 0.9 % IV SOLN
INTRAVENOUS | Status: DC
  Administered 2016-04-21: 09:00:00 via INTRAVENOUS

## 2016-04-20 MED ORDER — METHOCARBAMOL 1000 MG/10ML IJ SOLN
500.0000 mg | Freq: Four times a day (QID) | INTRAVENOUS | Status: DC | PRN
  Filled 2016-04-20: qty 5

## 2016-04-20 MED ORDER — HYDROMORPHONE HCL 2 MG/ML IJ SOLN
0.5000 mg | INTRAMUSCULAR | Status: DC | PRN
  Administered 2016-04-20 – 2016-04-21 (×4): 1 mg via INTRAVENOUS
  Filled 2016-04-20 (×4): qty 1

## 2016-04-20 MED ORDER — ROCURONIUM BROMIDE 100 MG/10ML IV SOLN
INTRAVENOUS | Status: DC | PRN
  Administered 2016-04-20: 50 mg via INTRAVENOUS

## 2016-04-20 MED ORDER — CEFAZOLIN IN D5W 1 GM/50ML IV SOLN
1.0000 g | Freq: Three times a day (TID) | INTRAVENOUS | Status: AC
  Administered 2016-04-20 – 2016-04-21 (×2): 1 g via INTRAVENOUS
  Filled 2016-04-20 (×2): qty 50

## 2016-04-20 MED ORDER — ACETAMINOPHEN 650 MG RE SUPP: 650.0000 mg | RECTAL | Status: DC | PRN

## 2016-04-20 MED ORDER — ACETAMINOPHEN 325 MG PO TABS
650.0000 mg | ORAL_TABLET | ORAL | Status: DC | PRN
  Administered 2016-04-21: 650 mg via ORAL
  Filled 2016-04-20: qty 2

## 2016-04-20 MED ORDER — OXYCODONE-ACETAMINOPHEN 7.5-325 MG PO TABS: 1.0000 | ORAL_TABLET | Freq: Four times a day (QID) | ORAL | 0 refills | Status: DC | PRN

## 2016-04-20 MED ORDER — ONDANSETRON HCL 4 MG PO TABS: 4.0000 mg | ORAL_TABLET | Freq: Four times a day (QID) | ORAL | Status: DC | PRN

## 2016-04-20 MED ORDER — 0.9 % SODIUM CHLORIDE (POUR BTL) OPTIME
TOPICAL | Status: DC | PRN
  Administered 2016-04-20: 1000 mL

## 2016-04-20 MED ORDER — ONDANSETRON HCL 4 MG/2ML IJ SOLN
INTRAMUSCULAR | Status: AC
  Administered 2016-04-20: 4 mg via INTRAVENOUS
  Filled 2016-04-20: qty 2

## 2016-04-20 MED ORDER — OXYCODONE HCL 5 MG PO TABS
5.0000 mg | ORAL_TABLET | ORAL | Status: DC | PRN
  Administered 2016-04-20 – 2016-04-21 (×3): 10 mg via ORAL
  Filled 2016-04-20 (×3): qty 2

## 2016-04-20 MED ORDER — VANCOMYCIN HCL 500 MG IV SOLR
INTRAVENOUS | Status: DC | PRN
  Administered 2016-04-20: 500 mg via TOPICAL

## 2016-04-20 SURGICAL SUPPLY — 59 items
ADH SKN CLS APL DERMABOND .7 (GAUZE/BANDAGES/DRESSINGS)
BLADE CLIPPER SURG (BLADE) ×1 IMPLANT
BLADE SURG 15 STRL LF DISP TIS (BLADE) ×1 IMPLANT
BLADE SURG 15 STRL SS (BLADE) ×2
BUR ROUND FLUTED 4 SOFT TCH (BURR) ×2 IMPLANT
CORDS BIPOLAR (ELECTRODE) IMPLANT
COVER SURGICAL LIGHT HANDLE (MISCELLANEOUS) ×2 IMPLANT
DERMABOND ADVANCED (GAUZE/BANDAGES/DRESSINGS)
DERMABOND ADVANCED .7 DNX12 (GAUZE/BANDAGES/DRESSINGS) ×1 IMPLANT
DRAPE MICROSCOPE LEICA (MISCELLANEOUS) IMPLANT
DRAPE PROXIMA HALF (DRAPES) ×2 IMPLANT
DRAPE SURG 17X23 STRL (DRAPES) ×10 IMPLANT
DRSG MEPILEX BORDER 4X4 (GAUZE/BANDAGES/DRESSINGS) ×1 IMPLANT
DRSG MEPILEX BORDER 4X8 (GAUZE/BANDAGES/DRESSINGS) ×1 IMPLANT
DURAPREP 26ML APPLICATOR (WOUND CARE) ×2 IMPLANT
ELECT REM PT RETURN 9FT ADLT (ELECTROSURGICAL) ×2
ELECTRODE REM PT RTRN 9FT ADLT (ELECTROSURGICAL) ×1 IMPLANT
EVACUATOR 1/8 PVC DRAIN (DRAIN) IMPLANT
GAUZE XEROFORM 5X9 LF (GAUZE/BANDAGES/DRESSINGS) ×2 IMPLANT
GLOVE BIOGEL PI IND STRL 8 (GLOVE) ×2 IMPLANT
GLOVE BIOGEL PI INDICATOR 8 (GLOVE) ×2
GLOVE ORTHO TXT STRL SZ7.5 (GLOVE) ×4 IMPLANT
GOWN STRL REUS W/ TWL LRG LVL3 (GOWN DISPOSABLE) ×1 IMPLANT
GOWN STRL REUS W/ TWL XL LVL3 (GOWN DISPOSABLE) ×1 IMPLANT
GOWN STRL REUS W/TWL 2XL LVL3 (GOWN DISPOSABLE) ×2 IMPLANT
GOWN STRL REUS W/TWL LRG LVL3 (GOWN DISPOSABLE) ×2
GOWN STRL REUS W/TWL XL LVL3 (GOWN DISPOSABLE) ×2
KIT BASIN OR (CUSTOM PROCEDURE TRAY) ×2 IMPLANT
KIT ROOM TURNOVER OR (KITS) ×2 IMPLANT
MANIFOLD NEPTUNE II (INSTRUMENTS) ×1 IMPLANT
NDL 1/2 CIR MAYO (NEEDLE) ×1 IMPLANT
NDL ASP BONE MRW 8GX15 (NEEDLE) IMPLANT
NEEDLE 1/2 CIR MAYO (NEEDLE) ×2 IMPLANT
NEEDLE ASP BONE MRW 8GX15 (NEEDLE) ×2 IMPLANT
NEEDLE BONE MARROW 8GAX6 (NEEDLE) ×2 IMPLANT
NS IRRIG 1000ML POUR BTL (IV SOLUTION) ×2 IMPLANT
PACK ORTHO CERVICAL (CUSTOM PROCEDURE TRAY) ×2 IMPLANT
PACK VITOSS BIOACTIVE 10CC (Neuro Prosthesis/Implant) ×1 IMPLANT
PAD ARMBOARD 7.5X6 YLW CONV (MISCELLANEOUS) ×4 IMPLANT
SPONGE LAP 4X18 X RAY DECT (DISPOSABLE) ×4 IMPLANT
SPONGE SURGIFOAM ABS GEL 100 (HEMOSTASIS) IMPLANT
STAPLER VISISTAT 35W (STAPLE) ×1 IMPLANT
SUT BONE WAX W31G (SUTURE) ×2 IMPLANT
SUT STEEL 1 (SUTURE) IMPLANT
SUT STEEL 2 (SUTURE) IMPLANT
SUT VIC AB 0 CT1 27 (SUTURE) ×2
SUT VIC AB 0 CT1 27XBRD ANBCTR (SUTURE) ×1 IMPLANT
SUT VIC AB 1 CT1 27 (SUTURE) ×4
SUT VIC AB 1 CT1 27XBRD ANBCTR (SUTURE) IMPLANT
SUT VIC AB 2-0 CT1 27 (SUTURE) ×2
SUT VIC AB 2-0 CT1 TAPERPNT 27 (SUTURE) ×1 IMPLANT
SUT VIC AB 3-0 X1 27 (SUTURE) ×1 IMPLANT
SUT VICRYL 0 TIES 12 18 (SUTURE) ×2 IMPLANT
SUT VICRYL 4-0 PS2 18IN ABS (SUTURE) ×1 IMPLANT
TOWEL OR 17X24 6PK STRL BLUE (TOWEL DISPOSABLE) ×2 IMPLANT
TOWEL OR 17X26 10 PK STRL BLUE (TOWEL DISPOSABLE) ×2 IMPLANT
TRAY FOLEY W/METER SILVER 16FR (SET/KITS/TRAYS/PACK) IMPLANT
WATER STERILE IRR 1000ML POUR (IV SOLUTION) ×1 IMPLANT
YANKAUER SUCT BULB TIP NO VENT (SUCTIONS) ×1 IMPLANT

## 2016-04-20 NOTE — Discharge Instructions (Signed)
-  Cervical collar must be on at all times.  -No driving.  -No lifting, or strenuous activity.  -Can shower 5 days postop but cervical collar must be on. Use extra collar given by the hospital.  -Okay to do some walking around the house but do not recommend ambulating long distances.

## 2016-04-20 NOTE — Progress Notes (Signed)
After several attempts unable to place another iv site. Iv site noted to right hand.

## 2016-04-20 NOTE — Progress Notes (Signed)
Orthopedic Tech Progress Note Patient Details:  [name] [name] 11/28/1960 [id number]  Ortho Devices Type of Ortho Device: Soft collar Ortho Device/Splint Interventions: Application   Saul FordyceJennifer C Nilsa Macht 04/20/2016, 6:46 PM

## 2016-04-20 NOTE — Anesthesia Procedure Notes (Addendum)
Procedure Name: Intubation Date/Time: 04/20/2016 11:34 AM Performed by: Oletta Lamas Pre-anesthesia Checklist: Patient identified, Emergency Drugs available, Suction available and Patient being monitored Patient Re-evaluated:Patient Re-evaluated prior to inductionOxygen Delivery Method: Circle System Utilized Preoxygenation: Pre-oxygenation with 100% oxygen Intubation Type: IV induction Ventilation: Mask ventilation without difficulty Laryngoscope Size: Mac and 3 Grade View: Grade I Tube type: Oral Tube size: 7.5 mm Number of attempts: 1 Airway Equipment and Method: Stylet Placement Confirmation: ETT inserted through vocal cords under direct vision,  positive ETCO2 and breath sounds checked- equal and bilateral Secured at: 22 cm Tube secured with: Tape Dental Injury: Teeth and Oropharynx as per pre-operative assessment

## 2016-04-20 NOTE — Transfer of Care (Signed)
Immediate Anesthesia Transfer of Care Note  Patient: [name] [name]  Procedure(s) Performed: Procedure(s): c6-7 posterior fusion, ILIAC BONE MARROW ASPIRATE, WIRING (N/A)  Patient Location: PACU  Anesthesia Type:General  Level of Consciousness: awake, alert  and patient cooperative  Airway & Oxygen Therapy: Patient Spontanous Breathing  Post-op Assessment: Report given to RN and Post -op Vital signs reviewed and stable  Post vital signs: Reviewed and stable  Last Vitals:  Vitals:   04/20/16 0922 04/20/16 1333  BP: (!) 131/97 (P) 120/80  Pulse: 76   Resp: 20 (P) 18  Temp: 36.7 C (P) 36.3 C    Last Pain:  Vitals:   04/20/16 1333  TempSrc:   PainSc: (P) Asleep      Patients Stated Pain Goal: 6 (04/20/16 0947)  Complications: No apparent anesthesia complications

## 2016-04-20 NOTE — Progress Notes (Signed)
Spoke with [employer] Biotronik rep regarding patients pacemaker and no current needs at this time. States that pacemaker automatically reverts back to settings. Patient currently atrial paced.

## 2016-04-20 NOTE — Progress Notes (Signed)
Biotronic rep here with pt

## 2016-04-20 NOTE — H&P (Signed)
[name] [name] is an 56 y.o. male.   Chief Complaint: Neck pain and left upper extremity radiculopathy HPI: Patient with history of C6-7 pseudoarthrosis and the above complaint presents to the hospital today for surgical intervention. Progressively worsening symptoms. Failed conservative treatment.  Past Medical History:  Diagnosis Date  . Anginal pain (HCC)    "EVERY SINGLE DAY" (05/04/2015)  . Arthritis    "fingers; ankles" (05/04/2015)  . COPD (chronic obstructive pulmonary disease) (HCC)   . Daily headache    "hopefully this neck OR will help" (05/04/2015)  . Depression   . Dyspnea    on exertion  . Exertional shortness of breath   . GERD (gastroesophageal reflux disease)   . History of gout   . History of hiatal hernia   . History of shingles   . Hypertension    has never been on meds  . Neck pain    buldging disc  . Nocturia   . PONV (postoperative nausea and vomiting)    "just one time"  . Presence of permanent cardiac pacemaker   . Syncopal episodes   . Tachy-brady syndrome (HCC)   . Vasovagal syncope    last one Jan 2017  . Weakness    numbness in left hand    Past Surgical History:  Procedure Laterality Date  . ANTERIOR CERVICAL DECOMP/DISCECTOMY FUSION  05/04/2015  . ANTERIOR CERVICAL DECOMP/DISCECTOMY FUSION N/A 05/04/2015   Procedure: C6-7 Anterior Cervical Discectomy and Fusion, Allograft, Plate ;  Surgeon: Eldred Manges, MD;  Location: [facility] OR;  Service: Orthopedics;  Laterality: N/A;  . BACK SURGERY    . CARDIAC CATHETERIZATION     2014  . CARDIAC ELECTROPHYSIOLOGY MAPPING AND ABLATION    . CARDIAC ELECTROPHYSIOLOGY STUDY & DFT    . HERNIA REPAIR    . INSERT / REPLACE / REMOVE PACEMAKER     [facility].Dr.Tony Sharol Harness is cardiologist  . LOOP RECORDER INSERTION  07/1999   [name] 07/05/2010  . LOOP RECORDER REMOVAL  07/2001   [name] 07/05/2010  . SHOULDER ARTHROSCOPY W/ ROTATOR CUFF REPAIR Right   . UMBILICAL HERNIA REPAIR      Family History  Problem Relation  Age of Onset  . Hypertension Neg Hx   . Diabetes Neg Hx   . Coronary artery disease Neg Hx    Social History:  reports that he has quit smoking. His smoking use included Cigarettes. He has a 0.24 pack-year smoking history. He has never used smokeless tobacco. He reports that he drinks alcohol. He reports that he uses drugs, including Marijuana, about 3 times per week.  Allergies:  Allergies  Allergen Reactions  . No Known Allergies     Medications Prior to Admission  Medication Sig Dispense Refill  . escitalopram (LEXAPRO) 10 MG tablet Take 10 mg by mouth daily.  11  . oxazepam (SERAX) 30 MG capsule TAKE ONE CAPSULE BY MOUTH AT BEDTIME  3  . propranolol (INDERAL) 10 MG tablet Take 10 mg by mouth 3 (three) times daily.     . ranitidine (ZANTAC) 300 MG tablet TAKE 1 TABLET BY MOUTH AT BEDTIME DAILY  6  . cyclobenzaprine (FLEXERIL) 5 MG tablet Take 10 mg by mouth 3 (three) times daily as needed for muscle spasms.     . fludrocortisone (FLORINEF) 0.1 MG tablet Take 1 tablet (0.1 mg total) by mouth 2 (two) times daily. (Patient not taking: Reported on 03/01/2016) 60 tablet 11  . HYDROcodone-acetaminophen (NORCO/VICODIN) 5-325 MG tablet Take 1 tablet by  mouth every 6 (six) hours as needed for moderate pain.    . methocarbamol (ROBAXIN) 500 MG tablet Take 1 tablet (500 mg total) by mouth every 6 (six) hours as needed for muscle spasms. (Patient not taking: Reported on 03/01/2016) 60 tablet 0  . oxyCODONE-acetaminophen (PERCOCET/ROXICET) 5-325 MG tablet Take 1-2 tablets by mouth every 6 (six) hours as needed for moderate pain. (Patient not taking: Reported on 03/01/2016) 60 tablet 0    No results found for this or any previous visit (from the past 48 hour(s)). No results found.  Review of Systems  Constitutional: Negative.   HENT: Negative.   Respiratory: Negative.   Cardiovascular: Negative.   Genitourinary: Negative.   Musculoskeletal: Positive for neck pain.  Skin: Negative.    Neurological: Positive for tingling and focal weakness.  Psychiatric/Behavioral: Negative.     Blood pressure (!) 131/97, pulse 76, temperature 98 F (36.7 C), temperature source Oral, resp. rate 20, height 5\' 9"  (1.753 m), weight 222 lb 8 oz (100.9 kg), SpO2 100 %. Physical Exam  Constitutional: He is oriented to person, place, and time. He appears well-nourished. No distress.  HENT:  Head: Normocephalic and atraumatic.  Neck: No tracheal deviation present. No thyromegaly present.  Respiratory: No respiratory distress.  GI: He exhibits no distension.  Musculoskeletal: He exhibits tenderness.  Musculoskeletal:  Patient's 50% rotation left and right with endpoint pain. Brachioplexus tenderness worse on the left than right side. Reflexes are 2+ and symmetrical sensation is hand is normal no evidence of peripheral nerve compression ulnar nerve at the elbow median nerve of the forearm or wrist. Positive Spurling on the left negative on the right. Lower extremity reflexes are 2+. No supraclavicular lymphadenopathy. Pain with cervical compression.   Neurological: He is alert and oriented to person, place, and time.  Skin: Skin is warm and dry.     Assessment/Plan C6-7 pseudoarthrosis with neck pain and left upper extremity radiculopathy  We'll proceed with  C6-7 POSTERIOR FUSION, POSSIBLE LEFT FORAMINOTOMY, ILIAC BONE MARROW ASPIRATE, WIRING and scheduled. Surgical procedure along with possible rehabilitation/recovery time discussed. All questions answered, and patient wishes to proceed.    Zonia KiefJames Siddhanth Denk, PA-C 04/20/2016, 9:43 AM

## 2016-04-20 NOTE — Interval H&P Note (Signed)
History and Physical Interval Note:  04/20/2016 11:19 AM  [name] [name]  has presented today for surgery, with the diagnosis of C6-7 Pseudarthrosis  The various methods of treatment have been discussed with the patient and family. After consideration of risks, benefits and other options for treatment, the patient has consented to  Procedure(s): C6-7 POSTERIOR FUSION, POSSIBLE LEFT FORAMINOTOMY, ILIAC BONE MARROW ASPIRATE, WIRING (N/A) as a surgical intervention .  The patient's history has been reviewed, patient examined, no change in status, stable for surgery.  I have reviewed the patient's chart and labs.  Questions were answered to the patient's satisfaction.     Eldred MangesMark C Rainier Feuerborn

## 2016-04-21 DIAGNOSIS — M960 Pseudarthrosis after fusion or arthrodesis: Secondary | ICD-10-CM | POA: Diagnosis not present

## 2016-04-21 MED ORDER — PROMETHAZINE HCL 50 MG PO TABS: 25.0000 mg | ORAL_TABLET | Freq: Four times a day (QID) | ORAL | 0 refills | Status: DC | PRN

## 2016-04-21 MED ORDER — PROMETHAZINE HCL 25 MG/ML IJ SOLN
12.5000 mg | Freq: Four times a day (QID) | INTRAMUSCULAR | Status: DC | PRN
  Administered 2016-04-21: 12.5 mg via INTRAVENOUS
  Filled 2016-04-21: qty 1

## 2016-04-21 MED ORDER — KETOROLAC TROMETHAMINE 30 MG/ML IJ SOLN
30.0000 mg | Freq: Four times a day (QID) | INTRAMUSCULAR | Status: DC | PRN
  Administered 2016-04-22: 30 mg via INTRAVENOUS
  Filled 2016-04-21: qty 1

## 2016-04-21 NOTE — Clinical Social Work Note (Signed)
CSW acknowledges SNF consult. Please put in PT/OT orders.  Charlynn CourtSarah Cristalle Rohm, CSW [phone]

## 2016-04-21 NOTE — Progress Notes (Signed)
Patient continues to experience high levels of pain not controlled by PO medication alone, requires IV dilaudid PRN for pain to be tolerable. Also experiencing intermittent nausea. Patient's wife expressing desire to have patient stay one additional night due to high pain levels. Called Dr. Ophelia CharterYates, he is agreeable to discharge patient in the morning, and gave verbal order to add IV toradol. This RN will change surgical bandage at this time and continue to monitor.

## 2016-04-21 NOTE — Op Note (Signed)
Operative note surgery date 04/20/2016  Preop diagnosis: C6-7 pseudoarthrosis, symptomatic  Postop diagnosis: Same  Procedure: C6-7 posterior cervical fusion, wiring with iliac bone marrow aspirate and Vitoss   Surgeon: Rodell Perna M.D.  Assistant: Benjiman Core PA-C medically necessary and present for the entire procedure  Implants: 24-gauge stainless steel wire 3. 10 mL Vitoss strip  Procedure after induction general anesthesia operculum patient patient was placed prone on a horseshoe head holder. Tape was applied upper thoracic region for skin pulled down. Area squared with 1010 drapes as well as the right posterior iliac crest. Arms were tucked at the side with padding yellow foam pads anterior to the shoulders as well as over the ulnar nerve.  Neck was prepped with DuraPrep there squared with towels sterile Mayo stand at the head Betadine Steri-Drape after use of a sterile skin marker in the midline and thyroid sheets and drapes. Timeout procedure was completed antibiotics were given prophylactically. Incision was made from C5-T1 so process section over the spinous process at C7. Crosstable lateral C-arm was sterilely draped was used for confirmation with a Coker clamp  originally placed between C5 and 6 could be visualized. C6-7 interspace interspinous ligament was marked with a permanent marker. Using a Coker clamp there was trace motion between the spinous process at C7 and C6 as expected. Soft tissues cleaned off the top of C6. Subperiosteal dissection on the lamina was performed. Right angle clamp was used to pass 24-gauge wire that been twisted into a cable around the spinous processes of C6 and C7. There was slight upsloping posteriorly at the back of C6 and the notch did not need to be made in order to hold the wire securely. It was tightened down securely tested with the clamp and was secure and tight. 4 mm bur was used to bur up the lamina. Iliac aspirate a been performed marked spurring  and blood was obtained from stab incision using the iliac aspirate kit. Initially the Sharpoint was started to get to the cortex and then switched to the dorsal trocar as it was advanced down between the inner and outer table of the iliac crest. Aspiration was performed with 10 mL as it was slowly being backed out and move side to side to maximize the amount of bone progenitor cells. Blood was placed on the Vitoss strip and allowed to saturate for 5-10 minutes. It was cut in half and after decortication of the lamina at C6 and C7 1 half the strip was placed on each side impacted. Vancomycin powder was tested in to the incision to help prevent infection. Wire was tight and were the wired been coded been turned down so would not be prominent. The fascia was closed with the #1 Vicryl 2-0 Vicryl subtendinous tissue skin staple closure and postop dressing.

## 2016-04-21 NOTE — Progress Notes (Signed)
   Subjective: 1 Day Post-Op Procedure(s) (LRB): c6-7 posterior fusion, ILIAC BONE MARROW ASPIRATE, WIRING (N/A) Patient reports pain as moderate.    Objective: Vital signs in last 24 hours: Temp:  [97.4 F (36.3 C)-98.1 F (36.7 C)] 98.1 F (36.7 C) (03/03 0424) Pulse Rate:  [60-99] 65 (03/03 0424) Resp:  [9-20] 16 (03/03 0424) BP: (93-130)/(67-89) 113/71 (03/03 0424) SpO2:  [92 %-100 %] 98 % (03/03 0424)  Intake/Output from previous day: 03/02 0701 - 03/03 0700 In: 340 [P.O.:240; I.V.:100] Out: 300 [Urine:200; Blood:100] Intake/Output this shift: Total I/O In: 240 [P.O.:240] Out: -   No results for input(s): HGB in the last 72 hours. No results for input(s): WBC, RBC, HCT, PLT in the last 72 hours. No results for input(s): NA, K, CL, CO2, BUN, CREATININE, GLUCOSE, CALCIUM in the last 72 hours. No results for input(s): LABPT, INR in the last 72 hours.  Neurologically intact Dg Cervical Spine 2-3 Views  Result Date: 04/20/2016 CLINICAL DATA:  Posterior C6-7 fusion EXAM: DG C-ARM 61-120 MIN; CERVICAL SPINE - 2-3 VIEW COMPARISON:  CT cervical spine 02/28/2016 FINDINGS: C-arm images were obtained in the operating room. Limited visualization of the lower cervical spine due to overlying shoulders. Initial localizer demonstrates an instrument between the C5 and C6 spinous processes. There is a pre-existing anterior fusion at C6-7. Subsequent images reveal posterior cerclage wires at C6-7. This is best seen on the oblique view and appears to be this same level as the anterior fusion. IMPRESSION: Posterior cerclage wire around C6-7 posterior elements. Follow-up radiographs suggested for better anatomic detail. Electronically Signed   By: Marlan Palauharles  Clark M.D.   On: 04/20/2016 13:03   Dg C-arm 1-60 Min  Result Date: 04/20/2016 CLINICAL DATA:  Posterior C6-7 fusion EXAM: DG C-ARM 61-120 MIN; CERVICAL SPINE - 2-3 VIEW COMPARISON:  CT cervical spine 02/28/2016 FINDINGS: C-arm images were  obtained in the operating room. Limited visualization of the lower cervical spine due to overlying shoulders. Initial localizer demonstrates an instrument between the C5 and C6 spinous processes. There is a pre-existing anterior fusion at C6-7. Subsequent images reveal posterior cerclage wires at C6-7. This is best seen on the oblique view and appears to be this same level as the anterior fusion. IMPRESSION: Posterior cerclage wire around C6-7 posterior elements. Follow-up radiographs suggested for better anatomic detail. Electronically Signed   By: Marlan Palauharles  Clark M.D.   On: 04/20/2016 13:03    Assessment/Plan: 1 Day Post-Op Procedure(s) (LRB): c6-7 posterior fusion, ILIAC BONE MARROW ASPIRATE, WIRING (N/A) Plan : Dressing change, DC IV and discharged home. Office follow-up 2 weeks. Eldred MangesMark C Arsenio Schnorr 04/21/2016, 11:39 AM

## 2016-04-21 NOTE — Care Management Obs Status (Signed)
MEDICARE OBSERVATION STATUS NOTIFICATION   Patient Details  Name: [name] RightDanny L Posadas MRN: [id number] Date of Birth: 12/13/1960   Medicare Observation Status Notification Given:  Yes    Durenda GuthrieBrady, Rosena Bartle Naomi, RN 04/21/2016, 11:25 AM

## 2016-04-22 DIAGNOSIS — M960 Pseudarthrosis after fusion or arthrodesis: Secondary | ICD-10-CM | POA: Diagnosis not present

## 2016-04-22 NOTE — Progress Notes (Signed)
Discharge instructions reviewed with patient and wife, questions answered, verbalized understanding.  Prescriptions including pain medication given to patient.  Patient transported to front of hospital to be taken home by wife.

## 2016-04-22 NOTE — Progress Notes (Addendum)
   Subjective: 2 Days Post-Op Procedure(s) (LRB): c6-7 posterior fusion, ILIAC BONE MARROW ASPIRATE, WIRING (N/A) Patient reports pain as mild.  Patient had uncontrolled pain yesterday rated at 7 and required IV pain meds and did not go home. Also got IV toradol which helped his pain level.  Ready for discharge this AM  Objective: Vital signs in last 24 hours: Temp:  [98.2 F (36.8 C)-98.6 F (37 C)] 98.3 F (36.8 C) (03/04 0422) Pulse Rate:  [66-71] 71 (03/03 2030) Resp:  [16] 16 (03/04 0422) BP: (106-121)/(62-73) 106/62 (03/04 0422) SpO2:  [97 %-100 %] 99 % (03/04 0422)  Intake/Output from previous day: 03/03 0701 - 03/04 0700 In: 1292.5 [P.O.:720; I.V.:572.5] Out: 500 [Urine:500] Intake/Output this shift: No intake/output data recorded.  No results for input(s): HGB in the last 72 hours. No results for input(s): WBC, RBC, HCT, PLT in the last 72 hours. No results for input(s): NA, K, CL, CO2, BUN, CREATININE, GLUCOSE, CALCIUM in the last 72 hours. No results for input(s): LABPT, INR in the last 72 hours.  Neurologically intact No results found.  Assessment/Plan: 2 Days Post-Op Procedure(s) (LRB): c6-7 posterior fusion, ILIAC BONE MARROW ASPIRATE, WIRING (N/A) Plan:  Discharge Home. Office 2 wks  Eldred MangesMark C Tyleah Loh 04/22/2016, 8:43 AM

## 2016-04-25 ENCOUNTER — Telehealth (INDEPENDENT_AMBULATORY_CARE_PROVIDER_SITE_OTHER): Payer: Self-pay | Admitting: *Deleted

## 2016-04-25 MED ORDER — HYDROCODONE-ACETAMINOPHEN 10-325 MG PO TABS: ORAL_TABLET | ORAL | 0 refills | Status: DC

## 2016-04-25 NOTE — Telephone Encounter (Signed)
Can you please see if Dr. August Saucerean can advise since Dr. Ophelia CharterYates is out of the office? Patient had C6-7 posterior fusion, iliac bone marrow aspirate, wiring on 04/20/2016. He was given oxycodone 7.5/325 and robaxin. He cannot take the oxycodone. It is making him sweat, "jacking him up", keeping him up at night, he hasn't slept since surgery, and he is nauseated. Is there something else that he can do?

## 2016-04-25 NOTE — Telephone Encounter (Signed)
Yates pt Rx issue, pls advise

## 2016-04-25 NOTE — Telephone Encounter (Signed)
Pts wife called stating the pain medication pt is on isn't working and is making pt sick. Pt wife stated pt is "wide open and needs something to bring him down"

## 2016-04-25 NOTE — Telephone Encounter (Signed)
Okay for Norco 11/22/2023 one by mouth every 4-6 hours when necessary pain #60 with no refill please call thanks

## 2016-04-25 NOTE — Telephone Encounter (Signed)
I called patient and advised, [place].

## 2016-04-26 ENCOUNTER — Encounter (HOSPITAL_COMMUNITY): Payer: Self-pay | Admitting: Orthopaedic Surgery

## 2016-04-26 ENCOUNTER — Telehealth (INDEPENDENT_AMBULATORY_CARE_PROVIDER_SITE_OTHER): Payer: Self-pay | Admitting: Radiology

## 2016-04-26 NOTE — Telephone Encounter (Signed)
[facility] called about the norco Rx Dr August Saucerean wrote yesterday for patient, see your previous note, I advised them ok to fill norco.  FYI to you.  Wife had told [facility] that percocet made him sick. Your note stated otherwise, just wanted you to know. No action needed.

## 2016-04-26 NOTE — Telephone Encounter (Signed)
noted 

## 2016-04-29 NOTE — Anesthesia Preprocedure Evaluation (Signed)
Anesthesia Evaluation  Patient identified by MRN, date of birth, ID band Patient awake    Reviewed: Allergy & Precautions, NPO status , Patient's Chart, lab work & pertinent test results  History of Anesthesia Complications (+) PONV  Airway Mallampati: I  TM Distance: >3 FB Neck ROM: Full    Dental   Pulmonary COPD, former smoker,    Pulmonary exam normal        Cardiovascular hypertension, Pt. on medications Normal cardiovascular exam+ pacemaker      Neuro/Psych Depression    GI/Hepatic GERD  Medicated and Controlled,  Endo/Other    Renal/GU      Musculoskeletal   Abdominal   Peds  Hematology   Anesthesia Other Findings   Reproductive/Obstetrics                             Anesthesia Physical Anesthesia Plan  ASA: III  Anesthesia Plan: General   Post-op Pain Management:    Induction: Intravenous  Airway Management Planned: Oral ETT  Additional Equipment:   Intra-op Plan:   Post-operative Plan: Extubation in OR  Informed Consent: I have reviewed the patients History and Physical, chart, labs and discussed the procedure including the risks, benefits and alternatives for the proposed anesthesia with the patient or authorized representative who has indicated his/her understanding and acceptance.     Plan Discussed with: CRNA and Surgeon  Anesthesia Plan Comments:         Anesthesia Quick Evaluation

## 2016-04-29 NOTE — Anesthesia Postprocedure Evaluation (Signed)
Anesthesia Post Note  Patient: [name] [name]  Procedure(s) Performed: Procedure(s) (LRB): c6-7 posterior fusion, ILIAC BONE MARROW ASPIRATE, WIRING (N/A)  Patient location during evaluation: PACU Anesthesia Type: General Level of consciousness: awake and alert Pain management: pain level controlled Vital Signs Assessment: post-procedure vital signs reviewed and stable Respiratory status: spontaneous breathing, nonlabored ventilation, respiratory function stable and patient connected to nasal cannula oxygen Cardiovascular status: blood pressure returned to baseline and stable Postop Assessment: no signs of nausea or vomiting Anesthetic complications: no       Last Vitals:  Vitals:   04/21/16 2030 04/22/16 0422  BP: 121/70 106/62  Pulse: 71   Resp: 16 16  Temp: 36.8 C 36.8 C    Last Pain:  Vitals:   04/22/16 0925  TempSrc:   PainSc: 4                  Ilsa Bonello DAVID

## 2016-04-29 NOTE — Discharge Summary (Signed)
Patient ID: [name] [name] MRN: [id number] DOB/AGE: 54/06/1960 54 y.o.  Admit date: 04/20/2016 Discharge date: 04/29/2016  Admission Diagnoses:  Active Problems:   Cervical pseudoarthrosis [facility](HCC)   Discharge Diagnoses:  Active Problems:   Cervical pseudoarthrosis (HCC)  status post Procedure(s): c6-7 posterior fusion, ILIAC BONE MARROW ASPIRATE, WIRING  Past Medical History:  Diagnosis Date  . Anginal pain (HCC)    "EVERY SINGLE DAY" (05/04/2015)  . Arthritis    "fingers; ankles" (05/04/2015)  . COPD (chronic obstructive pulmonary disease) (HCC)   . Daily headache    "hopefully this neck OR will help" (05/04/2015)  . Depression   . Dyspnea    on exertion  . Exertional shortness of breath   . GERD (gastroesophageal reflux disease)   . History of gout   . History of hiatal hernia   . History of shingles   . Hypertension    has never been on meds  . Neck pain    buldging disc  . Nocturia   . PONV (postoperative nausea and vomiting)    "just one time"  . Presence of permanent cardiac pacemaker   . Syncopal episodes   . Tachy-brady syndrome (HCC)   . Vasovagal syncope    last one Jan 2017  . Weakness    numbness in left hand    Surgeries: Procedure(s): c6-7 posterior fusion, ILIAC BONE MARROW ASPIRATE, WIRING on 04/20/2016   Consultants:   Discharged Condition: Improved  Hospital Course: [name] [name] is an 56 y.o. male who was admitted 04/20/2016 for operative treatment of C6-7 pseudoarthrosis. Patient failed conservative treatments (please see the history and physical for the specifics) and had severe unremitting pain that affects sleep, daily activities and work/hobbies. After pre-op clearance, the patient was taken to the operating room on 04/20/2016 and underwent  Procedure(s): c6-7 posterior fusion, ILIAC BONE MARROW ASPIRATE, WIRING.    Patient was given perioperative antibiotics:  Anti-infectives    Start     Dose/Rate Route Frequency Ordered Stop   04/20/16  2000  ceFAZolin (ANCEF) IVPB 1 g/50 mL premix     1 g 100 mL/hr over 30 Minutes Intravenous Every 8 hours 04/20/16 1835 04/21/16 0605   04/20/16 1235  vancomycin (VANCOCIN) powder  Status:  Discontinued       As needed 04/20/16 1235 04/20/16 1330   04/20/16 0932  ceFAZolin (ANCEF) IVPB 2g/100 mL premix     2 g 200 mL/hr over 30 Minutes Intravenous On call to O.R. 04/20/16 0932 04/20/16 1143       Patient was given sequential compression devices and early ambulation to prevent DVT.   Patient benefited maximally from hospital stay and there were no complications. At the time of discharge, the patient was urinating/moving their bowels without difficulty, tolerating a regular diet, pain is controlled with oral pain medications and they have been cleared by PT/OT.   Recent vital signs: No data found.    Recent laboratory studies: No results for input(s): WBC, HGB, HCT, PLT, NA, K, CL, CO2, BUN, CREATININE, GLUCOSE, INR, CALCIUM in the last 72 hours.  Invalid input(s): PT, 2   Discharge Medications:   Allergies as of 04/22/2016      Reactions   No Known Allergies       Medication List    STOP taking these medications   cyclobenzaprine 5 MG tablet Commonly known as:  FLEXERIL   HYDROcodone-acetaminophen 5-325 MG tablet Commonly known as:  NORCO/VICODIN   oxyCODONE-acetaminophen 5-325 MG tablet Commonly  known as:  PERCOCET/ROXICET Replaced by:  oxyCODONE-acetaminophen 7.5-325 MG tablet     TAKE these medications   escitalopram 10 MG tablet Commonly known as:  LEXAPRO Take 10 mg by mouth daily.   fludrocortisone 0.1 MG tablet Commonly known as:  FLORINEF Take 1 tablet (0.1 mg total) by mouth 2 (two) times daily.   methocarbamol 500 MG tablet Commonly known as:  ROBAXIN Take 1 tablet (500 mg total) by mouth 4 (four) times daily. What changed:  when to take this  reasons to take this   oxazepam 30 MG capsule Commonly known as:  SERAX TAKE ONE CAPSULE BY MOUTH AT  BEDTIME   oxyCODONE-acetaminophen 7.5-325 MG tablet Commonly known as:  PERCOCET Take 1 tablet by mouth every 6 (six) hours as needed for severe pain. Replaces:  oxyCODONE-acetaminophen 5-325 MG tablet   promethazine 50 MG tablet Commonly known as:  PHENERGAN Take 0.5 tablets (25 mg total) by mouth every 6 (six) hours as needed for nausea or vomiting.   propranolol 10 MG tablet Commonly known as:  INDERAL Take 10 mg by mouth 3 (three) times daily.   ranitidine 300 MG tablet Commonly known as:  ZANTAC TAKE 1 TABLET BY MOUTH AT BEDTIME DAILY       Diagnostic Studies: Dg Cervical Spine 2-3 Views  Result Date: 04/20/2016 CLINICAL DATA:  Posterior C6-7 fusion EXAM: DG C-ARM 61-120 MIN; CERVICAL SPINE - 2-3 VIEW COMPARISON:  CT cervical spine 02/28/2016 FINDINGS: C-arm images were obtained in the operating room. Limited visualization of the lower cervical spine due to overlying shoulders. Initial localizer demonstrates an instrument between the C5 and C6 spinous processes. There is a pre-existing anterior fusion at C6-7. Subsequent images reveal posterior cerclage wires at C6-7. This is best seen on the oblique view and appears to be this same level as the anterior fusion. IMPRESSION: Posterior cerclage wire around C6-7 posterior elements. Follow-up radiographs suggested for better anatomic detail. Electronically Signed   By: Marlan Palau M.D.   On: 04/20/2016 13:03   Dg C-arm 1-60 Min  Result Date: 04/20/2016 CLINICAL DATA:  Posterior C6-7 fusion EXAM: DG C-ARM 61-120 MIN; CERVICAL SPINE - 2-3 VIEW COMPARISON:  CT cervical spine 02/28/2016 FINDINGS: C-arm images were obtained in the operating room. Limited visualization of the lower cervical spine due to overlying shoulders. Initial localizer demonstrates an instrument between the C5 and C6 spinous processes. There is a pre-existing anterior fusion at C6-7. Subsequent images reveal posterior cerclage wires at C6-7. This is best seen on the  oblique view and appears to be this same level as the anterior fusion. IMPRESSION: Posterior cerclage wire around C6-7 posterior elements. Follow-up radiographs suggested for better anatomic detail. Electronically Signed   By: Marlan Palau M.D.   On: 04/20/2016 13:03      Follow-up Information    Eldred Manges, MD. Schedule an appointment as soon as possible for a visit in 2 week(s).   Specialty:  Orthopedic Surgery Why:  need return office visit one week postop Contact information: [address] [place] [address] [phone]           Discharge Plan:  discharge to home  Disposition:     Signed: Zonia Kief  04/29/2016, 11:16 PM

## 2016-04-30 ENCOUNTER — Encounter (HOSPITAL_COMMUNITY): Payer: Self-pay | Admitting: Orthopaedic Surgery

## 2016-05-10 ENCOUNTER — Ambulatory Visit (INDEPENDENT_AMBULATORY_CARE_PROVIDER_SITE_OTHER): Payer: Medicare HMO

## 2016-05-10 ENCOUNTER — Ambulatory Visit (INDEPENDENT_AMBULATORY_CARE_PROVIDER_SITE_OTHER): Payer: Medicare HMO | Admitting: Orthopaedic Surgery

## 2016-05-10 VITALS — BP 124/82 | HR 77 | Ht 69.0 in | Wt 215.0 lb

## 2016-05-10 DIAGNOSIS — S129XXD Fracture of neck, unspecified, subsequent encounter: Secondary | ICD-10-CM

## 2016-05-10 NOTE — Progress Notes (Signed)
Post-Op Visit Note   Patient: [name] [name]           Date of Birth: 09/30/1960           MRN: [id number] Visit Date: 05/10/2016 PCP: Samuel JesterYNTHIA BUTLER, DO   Assessment & Plan: Post posterior cervical fusion for one level anterior ACDF at C6-7 with pseudoarthrosis.  Chief Complaint:  Chief Complaint  Patient presents with  . Neck - Routine Post Op   Visit Diagnoses: Posterior cervical fusion for pseudoarthrosis.  Plan: X-rays look good good position the wire. He can return in 2 months for repeat AP and lateral cervical spine x-ray on return.  Follow-Up Instructions: No Follow-up on file.   Orders:  No orders of the defined types were placed in this encounter.  No orders of the defined types were placed in this encounter.  HPI Patient returns for his first post op visit. He is status post C6-7 posterior fusion, iliac bone marrow aspirate, wiring on 3/2/20018. He is 20 days post op. He continues to wear the collar.   Imaging: No results found.  PMFS History: Patient Active Problem List   Diagnosis Date Noted  . Cervical pseudoarthrosis (HCC) 04/20/2016  . Spondylosis, cervical 05/05/2015  . S/P cervical spinal fusion 05/04/2015  . NONSPECIFIC ABNORMAL UNSPEC CV FUNCTION STUDY 04/06/2009  . CHEST PAIN UNSPECIFIED 01/20/2009  . Syncope and collapse 01/12/2009   Past Medical History:  Diagnosis Date  . Anginal pain (HCC)    "EVERY SINGLE DAY" (05/04/2015)  . Arthritis    "fingers; ankles" (05/04/2015)  . COPD (chronic obstructive pulmonary disease) (HCC)   . Daily headache    "hopefully this neck OR will help" (05/04/2015)  . Depression   . Dyspnea    on exertion  . Exertional shortness of breath   . GERD (gastroesophageal reflux disease)   . History of gout   . History of hiatal hernia   . History of shingles   . Hypertension    has never been on meds  . Neck pain    buldging disc  . Nocturia   . PONV (postoperative nausea and vomiting)    "just one time"  .  Presence of permanent cardiac pacemaker   . Syncopal episodes   . Tachy-brady syndrome (HCC)   . Vasovagal syncope    last one Jan 2017  . Weakness    numbness in left hand    Family History  Problem Relation Age of Onset  . Hypertension Neg Hx   . Diabetes Neg Hx   . Coronary artery disease Neg Hx     Past Surgical History:  Procedure Laterality Date  . ANTERIOR CERVICAL DECOMP/DISCECTOMY FUSION  05/04/2015  . ANTERIOR CERVICAL DECOMP/DISCECTOMY FUSION N/A 05/04/2015   Procedure: C6-7 Anterior Cervical Discectomy and Fusion, Allograft, Plate ;  Surgeon: Eldred MangesMark C Aviona Martenson, MD;  Location: [facility] OR;  Service: Orthopedics;  Laterality: N/A;  . BACK SURGERY    . CARDIAC CATHETERIZATION     2014  . CARDIAC ELECTROPHYSIOLOGY MAPPING AND ABLATION    . CARDIAC ELECTROPHYSIOLOGY STUDY & DFT    . HERNIA REPAIR    . INSERT / REPLACE / REMOVE PACEMAKER     [facility].Dr.Tony Sharol HarnessSimmons is cardiologist  . LOOP RECORDER INSERTION  07/1999   [name]/notes 07/05/2010  . LOOP RECORDER REMOVAL  07/2001   [name]/notes 07/05/2010  . POSTERIOR CERVICAL FUSION/FORAMINOTOMY N/A 04/20/2016   Procedure: c6-7 posterior fusion, ILIAC BONE MARROW ASPIRATE, WIRING;  Surgeon: Eldred MangesMark C Aunesti Pellegrino, MD;  Location: [facility] OR;  Service: Orthopedics;  Laterality: N/A;  . SHOULDER ARTHROSCOPY W/ ROTATOR CUFF REPAIR Right   . UMBILICAL HERNIA REPAIR     Social History   Occupational History  . Full time    Social History Main Topics  . Smoking status: Former Smoker    Packs/day: 0.12    Years: 2.00    Types: Cigarettes  . Smokeless tobacco: Never Used     Comment: quit smoking in 1981  . Alcohol use Yes     Comment: occasional  . Drug use: Yes    Frequency: 3.0 times per week    Types: Marijuana     Comment: 2-3 times per week   . Sexual activity: Yes

## 2016-07-12 ENCOUNTER — Ambulatory Visit (INDEPENDENT_AMBULATORY_CARE_PROVIDER_SITE_OTHER): Payer: Medicare HMO | Admitting: Orthopaedic Surgery

## 2016-07-12 ENCOUNTER — Ambulatory Visit (INDEPENDENT_AMBULATORY_CARE_PROVIDER_SITE_OTHER): Payer: Medicare HMO

## 2016-07-12 ENCOUNTER — Encounter (INDEPENDENT_AMBULATORY_CARE_PROVIDER_SITE_OTHER): Payer: Self-pay | Admitting: Orthopaedic Surgery

## 2016-07-12 VITALS — BP 132/81 | HR 73 | Ht 69.0 in | Wt 213.0 lb

## 2016-07-12 DIAGNOSIS — S129XXD Fracture of neck, unspecified, subsequent encounter: Secondary | ICD-10-CM

## 2016-07-12 DIAGNOSIS — M7542 Impingement syndrome of left shoulder: Secondary | ICD-10-CM | POA: Diagnosis not present

## 2016-07-12 MED ORDER — BUPIVACAINE HCL 0.25 % IJ SOLN
4.0000 mL | INTRAMUSCULAR | Status: AC | PRN
  Administered 2016-07-12: 4 mL via INTRA_ARTICULAR

## 2016-07-12 MED ORDER — LIDOCAINE HCL 1 % IJ SOLN
1.0000 mL | INTRAMUSCULAR | Status: AC | PRN
  Administered 2016-07-12: 1 mL

## 2016-07-12 MED ORDER — METHYLPREDNISOLONE ACETATE 40 MG/ML IJ SUSP
40.0000 mg | INTRAMUSCULAR | Status: AC | PRN
  Administered 2016-07-12: 40 mg via INTRA_ARTICULAR

## 2016-07-12 NOTE — Progress Notes (Signed)
Office Visit Note   Patient: [name] [name]           Date of Birth: 10/06/1960           MRN: [id number] Visit Date: 07/12/2016              Requested by: Samuel Jester, DO [address] [place], [place] [address] PCP: Samuel Jester, DO   Assessment & Plan: Visit Diagnoses:  1. Impingement syndrome of left shoulder   2. Pseudoarthrosis of cervical spine, subsequent encounter     Plan: Subacromial injection performed which he tolerated well. He'll let us know if he has persistent problems.Postinjection he had good relief of his pain.  Follow-Up Instructions: Return in about 5 weeks (around 08/16/2016).   Orders:  Orders Placed This Encounter  Procedures  . Large Joint Injection/Arthrocentesis  . XR Cervical Spine 2 or 3 views   No orders of the defined types were placed in this encounter.     Procedures: Large Joint Inj Date/Time: 07/12/2016 10:40 AM Performed by: Eldred Manges Authorized by: Eldred Manges   Consent Given by:  Patient Indications:  Pain Location:  Shoulder Site:  L subacromial bursa Needle Size:  22 G Needle Length:  1.5 inches Ultrasound Guidance: No   Fluoroscopic Guidance: No   Arthrogram: No   Medications:  4 mL bupivacaine 0.25 %; 1 mL lidocaine 1 %; 40 mg methylPREDNISolone acetate 40 MG/ML Patient tolerance:  Patient tolerated the procedure well with no immediate complications      Clinical Data: No additional findings.   Subjective: Chief Complaint  Patient presents with  . Lower Back - Pain  . Neck - Routine Post Op    HPI patient currently states his neck is better still has some pain when he turns to the left. Suspect that some back pain and some pain that radiates in his left leg is not as severe as it has been in the past. Left shoulder continues to bother with outstretched reaching overhead activities but he still has full range of motion. No numbness or tingling in his hands.  Review of Systems 14 point review of  systems updated and is unchanged other than as mentioned in history of present illness from his office visit 05/10/2016. Objective: Vital Signs: BP 132/81   Pulse 73   Ht 5\' 9"  (1.753 m)   Wt 213 lb (96.6 kg)   BMI 31.45 kg/m   Physical Exam  Constitutional: He is oriented to person, place, and time. He appears well-developed and well-nourished.  HENT:  Head: Normocephalic and atraumatic.  Eyes: EOM are normal. Pupils are equal, round, and reactive to light.  Neck: No tracheal deviation present. No thyromegaly present.  Cardiovascular: Normal rate.   Pulmonary/Chest: Effort normal. He has no wheezes.  Abdominal: Soft. Bowel sounds are normal.  Musculoskeletal:  Patient has positive left shoulder impingement negative drop arm test. Long head of the biceps is normal reflexes are 2+. Well-healed anterior posterior cervical incisions. He has some mild limitation lacking 20 rotation the left rotation the right. Cervical flexion extension is normal. Negative impingement right shoulder. Well-healed arthroscopic portals right shoulder.  Neurological: He is alert and oriented to person, place, and time.  Skin: Skin is warm and dry. Capillary refill takes less than 2 seconds.  Psychiatric: He has a normal mood and affect. His behavior is normal. Judgment and thought content normal.    Ortho Exam  Specialty Comments:  No specialty comments  available.  Imaging: Xr Cervical Spine 2 Or 3 Views  Result Date: 07/12/2016 AP lateral cervical spine shows anterior plate F6-4C6-7 with progression of interbody healing. Posterior wiring with fusion C6-7. Impression: Satisfactory posterior fusion with wiring. Progression of interbody fusion at the disc space level at C6-7.    PMFS History: Patient Active Problem List   Diagnosis Date Noted  . Cervical pseudoarthrosis (HCC) 04/20/2016  . Spondylosis, cervical 05/05/2015  . S/P cervical spinal fusion 05/04/2015  . NONSPECIFIC ABNORMAL UNSPEC CV  FUNCTION STUDY 04/06/2009  . CHEST PAIN UNSPECIFIED 01/20/2009  . Syncope and collapse 01/12/2009   Past Medical History:  Diagnosis Date  . Anginal pain (HCC)    "EVERY SINGLE DAY" (05/04/2015)  . Arthritis    "fingers; ankles" (05/04/2015)  . COPD (chronic obstructive pulmonary disease) (HCC)   . Daily headache    "hopefully this neck OR will help" (05/04/2015)  . Depression   . Dyspnea    on exertion  . Exertional shortness of breath   . GERD (gastroesophageal reflux disease)   . History of gout   . History of hiatal hernia   . History of shingles   . Hypertension    has never been on meds  . Neck pain    buldging disc  . Nocturia   . PONV (postoperative nausea and vomiting)    "just one time"  . Presence of permanent cardiac pacemaker   . Syncopal episodes   . Tachy-brady syndrome (HCC)   . Vasovagal syncope    last one Jan 2017  . Weakness    numbness in left hand    Family History  Problem Relation Age of Onset  . Hypertension Neg Hx   . Diabetes Neg Hx   . Coronary artery disease Neg Hx     Past Surgical History:  Procedure Laterality Date  . ANTERIOR CERVICAL DECOMP/DISCECTOMY FUSION  05/04/2015  . ANTERIOR CERVICAL DECOMP/DISCECTOMY FUSION N/A 05/04/2015   Procedure: C6-7 Anterior Cervical Discectomy and Fusion, Allograft, Plate ;  Surgeon: Eldred MangesMark C Rachit Grim, MD;  Location: [facility] OR;  Service: Orthopedics;  Laterality: N/A;  . BACK SURGERY    . CARDIAC CATHETERIZATION     2014  . CARDIAC ELECTROPHYSIOLOGY MAPPING AND ABLATION    . CARDIAC ELECTROPHYSIOLOGY STUDY & DFT    . HERNIA REPAIR    . INSERT / REPLACE / REMOVE PACEMAKER     [facility].Dr.Tony Sharol HarnessSimmons is cardiologist  . LOOP RECORDER INSERTION  07/1999   [name]/notes 07/05/2010  . LOOP RECORDER REMOVAL  07/2001   [name]/notes 07/05/2010  . POSTERIOR CERVICAL FUSION/FORAMINOTOMY N/A 04/20/2016   Procedure: c6-7 posterior fusion, ILIAC BONE MARROW ASPIRATE, WIRING;  Surgeon: Eldred MangesMark C Makaveli Hoard, MD;  Location: [facility] OR;  Service:  Orthopedics;  Laterality: N/A;  . SHOULDER ARTHROSCOPY W/ ROTATOR CUFF REPAIR Right   . UMBILICAL HERNIA REPAIR     Social History   Occupational History  . Full time    Social History Main Topics  . Smoking status: Former Smoker    Packs/day: 0.12    Years: 2.00    Types: Cigarettes  . Smokeless tobacco: Never Used     Comment: quit smoking in 1981  . Alcohol use Yes     Comment: occasional  . Drug use: Yes    Frequency: 3.0 times per week    Types: Marijuana     Comment: 2-3 times per week   . Sexual activity: Yes

## 2016-08-16 ENCOUNTER — Ambulatory Visit (INDEPENDENT_AMBULATORY_CARE_PROVIDER_SITE_OTHER): Payer: Medicare HMO | Admitting: Orthopaedic Surgery

## 2016-08-16 ENCOUNTER — Encounter (INDEPENDENT_AMBULATORY_CARE_PROVIDER_SITE_OTHER): Payer: Self-pay | Admitting: Orthopaedic Surgery

## 2016-08-16 VITALS — BP 110/70 | HR 72 | Ht 69.0 in | Wt 215.0 lb

## 2016-08-16 DIAGNOSIS — M7542 Impingement syndrome of left shoulder: Secondary | ICD-10-CM

## 2016-08-16 DIAGNOSIS — Z981 Arthrodesis status: Secondary | ICD-10-CM

## 2016-08-16 NOTE — Progress Notes (Signed)
Office Visit Note   Patient: [name] [name]           Date of Birth: 09/06/1960           MRN: [id number] Visit Date: 08/16/2016              Requested by: Samuel JesterButler, Cynthia, DO [address] [place], [place] [id number] PCP: Samuel JesterButler, Cynthia, DO   Assessment & Plan: Visit Diagnoses:  1. Impingement syndrome of left shoulder   2. S/P cervical spinal fusion     Plan: We'll order an arthrogram CT scan evaluated for rotator cuff tear. Office follow-up after studies.  Follow-Up Instructions: No Follow-up on file.   Orders:  No orders of the defined types were placed in this encounter.  No orders of the defined types were placed in this encounter.     Procedures: No procedures performed   Clinical Data: No additional findings.   Subjective: Chief Complaint  Patient presents with  . Neck - Follow-up  . Left Shoulder - Follow-up    HPI patient returns post the surgery posteriorly on the cervical spine for pseudoarthrosis that was done in 2017. He's had persistent pain in the shoulder had a subacromial injection that helped for a few days then states he had increased problems getting his arm over his head. It's gotten a little bit better with rest but he states he still has pain with activities pain with outstretched reaching. Patient's had a pacemaker cannot have an MRI scan. He is to rest the shoulder used aspirin, Advil had a cortisone injection 524 with persistent symptoms now 1 month later. Patient's questing diagnostic imaging and states he thinks he is going to have to have surgery to fix it.  Review of Systems 14 review of systems is updated from last office visit 5/24 /2018 and is unchanged other than as mentioned in history of present illness.   Objective: Vital Signs: BP 110/70   Pulse 72   Ht 5\' 9"  (1.753 m)   Wt 215 lb (97.5 kg)   BMI 31.75 kg/m   Physical Exam  Constitutional: He is oriented to person, place, and time. He appears well-developed and  well-nourished.  HENT:  Head: Normocephalic and atraumatic.  Eyes: EOM are normal. Pupils are equal, round, and reactive to light.  Neck: No tracheal deviation present. No thyromegaly present.  Cardiovascular: Normal rate.   Pulmonary/Chest: Effort normal. He has no wheezes.  Abdominal: Soft. Bowel sounds are normal.  Musculoskeletal:  Patient still has some brachial plexus tenderness on the left. Pain with resisted supraspinatus testing negative drop arm test positive impingement test long of the biceps is moderately tender negative Yergason test. No limitation of internal rotation internal and external rotation testing is strong over its full extension reflexes are 2+ grip strength sensation in his hand is normal. Normal heel toe gait in the lower external hyperreflexia. No subclavicular lymphadenopathy.  Neurological: He is alert and oriented to person, place, and time.  Skin: Skin is warm and dry. Capillary refill takes less than 2 seconds.  Psychiatric: He has a normal mood and affect. His behavior is normal. Judgment and thought content normal.    Ortho Exam  Specialty Comments:  No specialty comments available.  Imaging: No results found.   PMFS History: Patient Active Problem List   Diagnosis Date Noted  . Cervical pseudoarthrosis (HCC) 04/20/2016  . Spondylosis, cervical 05/05/2015  . S/P cervical spinal fusion 05/04/2015  . NONSPECIFIC ABNORMAL UNSPEC CV  FUNCTION STUDY 04/06/2009  . CHEST PAIN UNSPECIFIED 01/20/2009  . Syncope and collapse 01/12/2009   Past Medical History:  Diagnosis Date  . Anginal pain (HCC)    "EVERY SINGLE DAY" (05/04/2015)  . Arthritis    "fingers; ankles" (05/04/2015)  . COPD (chronic obstructive pulmonary disease) (HCC)   . Daily headache    "hopefully this neck OR will help" (05/04/2015)  . Depression   . Dyspnea    on exertion  . Exertional shortness of breath   . GERD (gastroesophageal reflux disease)   . History of gout   . History  of hiatal hernia   . History of shingles   . Hypertension    has never been on meds  . Neck pain    buldging disc  . Nocturia   . PONV (postoperative nausea and vomiting)    "just one time"  . Presence of permanent cardiac pacemaker   . Syncopal episodes   . Tachy-brady syndrome (HCC)   . Vasovagal syncope    last one Jan 2017  . Weakness    numbness in left hand    Family History  Problem Relation Age of Onset  . Hypertension Neg Hx   . Diabetes Neg Hx   . Coronary artery disease Neg Hx     Past Surgical History:  Procedure Laterality Date  . ANTERIOR CERVICAL DECOMP/DISCECTOMY FUSION  05/04/2015  . ANTERIOR CERVICAL DECOMP/DISCECTOMY FUSION N/A 05/04/2015   Procedure: C6-7 Anterior Cervical Discectomy and Fusion, Allograft, Plate ;  Surgeon: Eldred Manges, MD;  Location: [facility] OR;  Service: Orthopedics;  Laterality: N/A;  . BACK SURGERY    . CARDIAC CATHETERIZATION     2014  . CARDIAC ELECTROPHYSIOLOGY MAPPING AND ABLATION    . CARDIAC ELECTROPHYSIOLOGY STUDY & DFT    . HERNIA REPAIR    . INSERT / REPLACE / REMOVE PACEMAKER     [facility].Dr.Tony Sharol Harness is cardiologist  . LOOP RECORDER INSERTION  07/1999   [name] 07/05/2010  . LOOP RECORDER REMOVAL  07/2001   [name] 07/05/2010  . POSTERIOR CERVICAL FUSION/FORAMINOTOMY N/A 04/20/2016   Procedure: c6-7 posterior fusion, ILIAC BONE MARROW ASPIRATE, WIRING;  Surgeon: Eldred Manges, MD;  Location: [facility] OR;  Service: Orthopedics;  Laterality: N/A;  . SHOULDER ARTHROSCOPY W/ ROTATOR CUFF REPAIR Right   . UMBILICAL HERNIA REPAIR     Social History   Occupational History  . Full time    Social History Main Topics  . Smoking status: Former Smoker    Packs/day: 0.12    Years: 2.00    Types: Cigarettes  . Smokeless tobacco: Never Used     Comment: quit smoking in 1981  . Alcohol use Yes     Comment: occasional  . Drug use: Yes    Frequency: 3.0 times per week    Types: Marijuana     Comment: 2-3 times per week   . Sexual activity:  Yes

## 2016-08-16 NOTE — Addendum Note (Signed)
Addended by: Rogers SeedsYEATTS, Pachia Strum M on: 08/16/2016 10:33 AM   Modules accepted: Orders

## 2016-08-23 ENCOUNTER — Encounter (INDEPENDENT_AMBULATORY_CARE_PROVIDER_SITE_OTHER): Payer: Self-pay | Admitting: Orthopaedic Surgery

## 2016-08-23 ENCOUNTER — Encounter: Payer: Self-pay | Admitting: Orthopaedic Surgery

## 2016-08-23 ENCOUNTER — Ambulatory Visit (INDEPENDENT_AMBULATORY_CARE_PROVIDER_SITE_OTHER): Payer: Medicare HMO | Admitting: Orthopaedic Surgery

## 2016-08-23 VITALS — BP 129/71 | HR 82 | Ht 69.0 in | Wt 215.0 lb

## 2016-08-23 DIAGNOSIS — M75112 Incomplete rotator cuff tear or rupture of left shoulder, not specified as traumatic: Secondary | ICD-10-CM | POA: Diagnosis not present

## 2016-08-23 NOTE — Progress Notes (Signed)
Office Visit Note   Patient: [name] [name]           Date of Birth: 03-Apr-1960           MRN: [id number] Visit Date: 08/23/2016              Requested by: Samuel Jester, DO [address] [place], [place] [address] PCP: Samuel Jester, DO   Assessment & Plan: Visit Diagnoses:  1. Nontraumatic incomplete tear of left rotator cuff       Fissure with tearing of the infraspinatus. Supraspinatus tendinopathy without tear. Before meals joint arthritis.  Plan: We'll set the patient for some physical therapy for her shoulder to discuss workout activities and shoulder strengthening activities that do not aggravate his supraspinatus and infraspinatus tendinopathy. I'll recheck him again in 5 weeks.  Follow-Up Instructions: Return in about 5 weeks (around 09/27/2016).   Orders:  No orders of the defined types were placed in this encounter.  No orders of the defined types were placed in this encounter.     Procedures: No procedures performed   Clinical Data: No additional findings.   Subjective: Chief Complaint  Patient presents with  . Left Shoulder - Follow-up    HPI 56 year old male returns post arthrogram CT scan of his left shoulder. This shows a fissure and infraspinatus with leakage of dye out of the joint and some tendinopathy. He does have some before meals joint degenerative changes and a type I acromion. He states his symptoms tend to wax and wane he said sometimes that he can even get his arm up over his head other times his symptoms are less severe and [place] able to workout use weights including shoulder and arm strengthening exercises without problems. He's had a previous cervical spine fusion for pseudoarthrosis. He denies any fever or chills.  Review of Systems or edema review of systems updated unchanged from 07/12/2016 other than as mentioned in history of present illness.   Objective: Vital Signs: BP 129/71   Pulse 82   Ht 5\' 9"  (1.753 m)   Wt 215 lb (97.5  kg)   BMI 31.75 kg/m   Physical Exam  Constitutional: He is oriented to person, place, and time. He appears well-developed and well-nourished.  HENT:  Head: Normocephalic and atraumatic.  Eyes: EOM are normal. Pupils are equal, round, and reactive to light.  Neck: No tracheal deviation present. No thyromegaly present.  Cardiovascular: Normal rate.   Pulmonary/Chest: Effort normal. He has no wheezes.  Abdominal: Soft. Bowel sounds are normal.  Musculoskeletal:  Patient's able get his left arm above his head today. He has some pain with supraspinatus resistance. Good cervical range of motion well-healed cervical incision from his surgery for pseudoarthrosis no rash or exposed skin no synovitis normal heel toe gait. Elbow reaches full extension. Minimal before meals joint tenderness.  Neurological: He is alert and oriented to person, place, and time.  Skin: Skin is warm and dry. Capillary refill takes less than 2 seconds.  Psychiatric: He has a normal mood and affect. His behavior is normal. Judgment and thought content normal.    Ortho Exam  Specialty Comments:  No specialty comments available.  Imaging: No results found.   PMFS History: Patient Active Problem List   Diagnosis Date Noted  . Cervical pseudoarthrosis (HCC) 04/20/2016  . Spondylosis, cervical 05/05/2015  . S/P cervical spinal fusion 05/04/2015  . NONSPECIFIC ABNORMAL UNSPEC CV FUNCTION STUDY 04/06/2009  . CHEST PAIN UNSPECIFIED 01/20/2009  .  Syncope and collapse 01/12/2009   Past Medical History:  Diagnosis Date  . Anginal pain (HCC)    "EVERY SINGLE DAY" (05/04/2015)  . Arthritis    "fingers; ankles" (05/04/2015)  . COPD (chronic obstructive pulmonary disease) (HCC)   . Daily headache    "hopefully this neck OR will help" (05/04/2015)  . Depression   . Dyspnea    on exertion  . Exertional shortness of breath   . GERD (gastroesophageal reflux disease)   . History of gout   . History of hiatal hernia   .  History of shingles   . Hypertension    has never been on meds  . Neck pain    buldging disc  . Nocturia   . PONV (postoperative nausea and vomiting)    "just one time"  . Presence of permanent cardiac pacemaker   . Syncopal episodes   . Tachy-brady syndrome (HCC)   . Vasovagal syncope    last one Jan 2017  . Weakness    numbness in left hand    Family History  Problem Relation Age of Onset  . Hypertension Neg Hx   . Diabetes Neg Hx   . Coronary artery disease Neg Hx     Past Surgical History:  Procedure Laterality Date  . ANTERIOR CERVICAL DECOMP/DISCECTOMY FUSION  05/04/2015  . ANTERIOR CERVICAL DECOMP/DISCECTOMY FUSION N/A 05/04/2015   Procedure: C6-7 Anterior Cervical Discectomy and Fusion, Allograft, Plate ;  Surgeon: Eldred MangesMark C Chett Taniguchi, MD;  Location: [facility] OR;  Service: Orthopedics;  Laterality: N/A;  . BACK SURGERY    . CARDIAC CATHETERIZATION     2014  . CARDIAC ELECTROPHYSIOLOGY MAPPING AND ABLATION    . CARDIAC ELECTROPHYSIOLOGY STUDY & DFT    . HERNIA REPAIR    . INSERT / REPLACE / REMOVE PACEMAKER     [facility].Dr.Tony Sharol HarnessSimmons is cardiologist  . LOOP RECORDER INSERTION  07/1999   [name]/notes 07/05/2010  . LOOP RECORDER REMOVAL  07/2001   [name]/notes 07/05/2010  . POSTERIOR CERVICAL FUSION/FORAMINOTOMY N/A 04/20/2016   Procedure: c6-7 posterior fusion, ILIAC BONE MARROW ASPIRATE, WIRING;  Surgeon: Eldred MangesMark C Vala Raffo, MD;  Location: [facility] OR;  Service: Orthopedics;  Laterality: N/A;  . SHOULDER ARTHROSCOPY W/ ROTATOR CUFF REPAIR Right   . UMBILICAL HERNIA REPAIR     Social History   Occupational History  . Full time    Social History Main Topics  . Smoking status: Former Smoker    Packs/day: 0.12    Years: 2.00    Types: Cigarettes  . Smokeless tobacco: Never Used     Comment: quit smoking in 1981  . Alcohol use Yes     Comment: occasional  . Drug use: Yes    Frequency: 3.0 times per week    Types: Marijuana     Comment: 2-3 times per week   . Sexual activity: Yes

## 2016-08-23 NOTE — Addendum Note (Signed)
Addended byPrescott Parma: Arra Connaughton on: 08/23/2016 02:14 PM   Modules accepted: Orders

## 2016-09-03 ENCOUNTER — Encounter: Payer: Self-pay | Admitting: Physical Therapy

## 2016-09-03 ENCOUNTER — Ambulatory Visit: Payer: Medicare HMO | Attending: Orthopaedic Surgery | Admitting: Physical Therapy

## 2016-09-03 DIAGNOSIS — M25512 Pain in left shoulder: Secondary | ICD-10-CM | POA: Insufficient documentation

## 2016-09-03 DIAGNOSIS — M542 Cervicalgia: Secondary | ICD-10-CM | POA: Insufficient documentation

## 2016-09-03 DIAGNOSIS — M6281 Muscle weakness (generalized): Secondary | ICD-10-CM | POA: Diagnosis present

## 2016-09-03 DIAGNOSIS — M25612 Stiffness of left shoulder, not elsewhere classified: Secondary | ICD-10-CM | POA: Insufficient documentation

## 2016-09-03 NOTE — Therapy (Signed)
[facility] [address] [place], [place], [address] Phone: [phone]   Fax:  [phone]  Physical Therapy Evaluation  Patient Details  Name: [name] [name] MRN: [id number] Date of Birth: 01/09/1961 Referring Provider: Annell GreeningMark Yates, MD  Encounter Date: 09/03/2016      PT End of Session - 09/03/16 1041    Visit Number 1   Number of Visits 12   Date for PT Re-Evaluation 10/15/16   PT Start Time 1042   PT Stop Time 1111   PT Time Calculation (min) 29 min   Activity Tolerance Patient tolerated treatment well   Behavior [facility] for tasks assessed/performed      Past Medical History:  Diagnosis Date  . Anginal pain (HCC)    "EVERY SINGLE DAY" (05/04/2015)  . Arthritis    "fingers; ankles" (05/04/2015)  . COPD (chronic obstructive pulmonary disease) (HCC)   . Daily headache    "hopefully this neck OR will help" (05/04/2015)  . Depression   . Dyspnea    on exertion  . Exertional shortness of breath   . GERD (gastroesophageal reflux disease)   . History of gout   . History of hiatal hernia   . History of shingles   . Hypertension    has never been on meds  . Neck pain    buldging disc  . Nocturia   . PONV (postoperative nausea and vomiting)    "just one time"  . Presence of permanent cardiac pacemaker   . Syncopal episodes   . Tachy-brady syndrome (HCC)   . Vasovagal syncope    last one Jan 2017  . Weakness    numbness in left hand    Past Surgical History:  Procedure Laterality Date  . ANTERIOR CERVICAL DECOMP/DISCECTOMY FUSION  05/04/2015  . ANTERIOR CERVICAL DECOMP/DISCECTOMY FUSION N/A 05/04/2015   Procedure: C6-7 Anterior Cervical Discectomy and Fusion, Allograft, Plate ;  Surgeon: Eldred MangesMark C Yates, MD;  Location: [facility] OR;  Service: Orthopedics;  Laterality: N/A;  . BACK SURGERY    . CARDIAC CATHETERIZATION     2014  . CARDIAC ELECTROPHYSIOLOGY MAPPING AND ABLATION    . CARDIAC ELECTROPHYSIOLOGY STUDY & DFT     . HERNIA REPAIR    . INSERT / REPLACE / REMOVE PACEMAKER     [facility].Dr.Tony Sharol HarnessSimmons is cardiologist  . LOOP RECORDER INSERTION  07/1999   [name]/notes 07/05/2010  . LOOP RECORDER REMOVAL  07/2001   [name]/notes 07/05/2010  . POSTERIOR CERVICAL FUSION/FORAMINOTOMY N/A 04/20/2016   Procedure: c6-7 posterior fusion, ILIAC BONE MARROW ASPIRATE, WIRING;  Surgeon: Eldred MangesMark C Yates, MD;  Location: [facility] OR;  Service: Orthopedics;  Laterality: N/A;  . SHOULDER ARTHROSCOPY W/ ROTATOR CUFF REPAIR Right   . UMBILICAL HERNIA REPAIR      There were no vitals filed for this visit.       Subjective Assessment - 09/03/16 1045    Subjective Patient reports that about 5 weeks ago he started having difficulty lifting his arm overhead.    Pertinent History PACEMAKER; cervical fusion march 2018   Currently in Pain? Yes   Pain Score 4    Pain Location Shoulder   Pain Orientation Left   Pain Descriptors / Indicators Dull   Pain Type Acute pain   Pain Radiating Towards into neck along UT/scalenes area   Pain Onset More than a month ago   Pain Frequency Intermittent   Aggravating Factors  turning his head and lifting arm   Pain Relieving Factors  advil   Effect of Pain on Daily Activities uanble to lift heavy stuff, sleep on left side            [facility] PT Assessment - 09/03/16 0001      Assessment   Medical Diagnosis NON TRAUMATIC TEAR OF L rc   Referring Provider Annell Greening, MD   Onset Date/Surgical Date 07/30/16     Precautions   Precautions ICD/Pacemaker   Precaution Comments NO ESTIM; recent cervical fusion     Balance Screen   Has the patient fallen in the past 6 months No   Has the patient had a decrease in activity level because of a fear of falling?  No   Is the patient reluctant to leave their home because of a fear of falling?  No     Prior Function   Level of Independence Independent   Vocation On disability     Observation/Other Assessments   Focus on Therapeutic Outcomes (FOTO)  48% limited      Posture/Postural Control   Posture/Postural Control Postural limitations   Postural Limitations Rounded Shoulders;Forward head     ROM / Strength   AROM / PROM / Strength AROM;PROM;Strength     AROM   Overall AROM Comments cervical SB L 28, R 20; L rotation limited 50%   AROM Assessment Site Shoulder   Right/Left Shoulder Left   Left Shoulder Flexion 158 Degrees  supine   Left Shoulder ABduction 128 Degrees   Left Shoulder Internal Rotation 90 Degrees  some pain at end range   Left Shoulder External Rotation 90 Degrees     PROM   PROM Assessment Site Shoulder   Right/Left Shoulder Left   Left Shoulder Flexion 161 Degrees   Left Shoulder ABduction 150 Degrees     Strength   Strength Assessment Site Shoulder   Right/Left Shoulder Left   Left Shoulder Flexion 4+/5   Left Shoulder Extension 5/5   Left Shoulder ABduction 4/5   Left Shoulder Internal Rotation 4+/5   Left Shoulder External Rotation 4-/5     Palpation   Palpation comment tender in L pectorals, scalenes, lev scapula, IS and supraspinatus muscle bellies more than tendons.            Objective measurements completed on examination: See above findings.                  PT Education - 09/03/16 1223    Education provided Yes   Education Details HEP   Person(s) Educated Patient   Methods Explanation;Demonstration;Tactile cues;Verbal cues;Handout   Comprehension Verbalized understanding;Returned demonstration             PT Long Term Goals - 09/03/16 1231      PT LONG TERM GOAL #1   Title I with HEP   Time 6   Period Weeks   Status New     PT LONG TERM GOAL #2   Title Patient able to demonstrate L shoulder flexion to 155 deg or better with good mechanics to normalize ADLS.   Time 6   Period Weeks   Status New     PT LONG TERM GOAL #3   Title Patient to demo 4+/5 L shoulder strength or better to normalize mechanics.   Time 6   Period Weeks   Status New     PT LONG TERM  GOAL #4   Title Patient able to perform ADLs with L shoulder with 1/61 pain or less.   Time 6  Period Weeks   Status New     PT LONG TERM GOAL #5   Title Patient able to turn head to left without pain.   Time 6   Period Weeks   Status New                Plan - 09/26/2016 1224    Clinical Impression Statement Patient presents for a low complexity eval for a nontraumatic incomplete tear of his L RC (infraspinatus). He has decreased L shoulder ROM and functional weakness in standing as well as pain. He also has decreased cervical ROM and pain in the left scalenes and Levator scap limiting motion.  He will benefit from PT to address these difficulties.   History and Personal Factors relevant to plan of care: Pacemaker; recent cervical fusion (march 2018)   Clinical Presentation Evolving   Clinical Presentation due to: getting worse   Clinical Decision Making Low   Rehab Potential Excellent   PT Frequency 2x / week   PT Duration 6 weeks   PT Treatment/Interventions ADLs/Self Care Home Management;Cryotherapy;Moist Heat;Ultrasound;Therapeutic exercise;Neuromuscular re-education;Passive range of motion;Manual techniques;Patient/family education;Taping;Vasopneumatic Device   PT Next Visit Plan STW to L scalenes, Lev Scap, post cuff muscles; strengthening L shoulder and scapular stabilizers; NO ESTIM DUE TO PACEMAKER; modalities prn.   PT Home Exercise Plan cervical tension stretches, scap retraction, corner stretch   Consulted and Agree with Plan of Care Patient      Patient will benefit from skilled therapeutic intervention in order to improve the following deficits and impairments:  Decreased strength, Pain, Impaired UE functional use, Decreased range of motion, Postural dysfunction  Visit Diagnosis: Stiffness of left shoulder, not elsewhere classified - Plan: PT plan of care cert/re-cert  Muscle weakness (generalized) - Plan: PT plan of care cert/re-cert  Cervicalgia - Plan: PT  plan of care cert/re-cert  Acute pain of left shoulder - Plan: PT plan of care cert/re-cert      G-Codes - Sep 26, 2016 1233    Functional Limitation Other PT primary   Other PT Primary Current Status (F6213) At least 40 percent but less than 60 percent impaired, limited or restricted   Other PT Primary Goal Status (Y8657) At least 20 percent but less than 40 percent impaired, limited or restricted       Problem List Patient Active Problem List   Diagnosis Date Noted  . Cervical pseudoarthrosis (HCC) 04/20/2016  . Spondylosis, cervical 05/05/2015  . S/P cervical spinal fusion 05/04/2015  . NONSPECIFIC ABNORMAL UNSPEC CV FUNCTION STUDY 04/06/2009  . CHEST PAIN UNSPECIFIED 01/20/2009  . Syncope and collapse 01/12/2009   Solon Palm PT 09-26-2016, 12:38 PM  [facility] [address] [place], [place], [address] Phone: [phone]   Fax:  [phone]  Name: [name] [name] MRN: [id number] Date of Birth: 03-Feb-1961

## 2016-09-03 NOTE — Patient Instructions (Signed)
  Flexibility: Upper Trapezius Stretch   Gently grasp right side of head while reaching behind back with other hand. Tilt head away until a gentle stretch is felt. Hold 30 seconds. Repeat 3 times per set. Do 2 sessions per day.  http://orth.exer.us/340   Levator Stretch   Grasp seat or sit on hand on side to be stretched. Turn head toward other side and look down. Use hand on head to gently stretch neck in that position. Hold _30___ seconds. Repeat on other side. Repeat 3 times. Do 2 sessions per day.  http://gt2.exer.us/30   Scapular Retraction (Standing)   With arms at sides, pinch shoulder blades together. Repeat 10 times per set. Do 1-3 sets per session. Do 2 sessions per day.  http://orth.exer.us/944     Flexibility: Neck Retraction   Pull head straight back, keeping eyes and jaw level. Hold 3-5 seconds. Repeat _10 times per set. Do 3-5  sessions per day.  http://orth.exer.us/344   Posture - Sitting   Sit upright, head facing forward. Try using a roll to support lower back. Keep shoulders relaxed, and avoid rounded back. Keep hips level with knees. Avoid crossing legs for long periods.   Flexibility: Corner Stretch   Standing in corner or a doorway with hands just above shoulder level.  Lean forward until a comfortable stretch is felt across chest. Hold __30__ seconds. Repeat __3__ times per set.  Do _2___ sessions per day.  http://orth.exer.us/342   Copyright  VHI. All rights reserved.   Solon PalmJulie Shardai Star, PT 09/03/16 11:09 AM  [facility] [address] [place], [place], [address] Phone: [phone]   Fax:  [phone]

## 2016-09-11 ENCOUNTER — Ambulatory Visit: Payer: Medicare HMO | Admitting: *Deleted

## 2016-09-11 DIAGNOSIS — M25512 Pain in left shoulder: Secondary | ICD-10-CM

## 2016-09-11 DIAGNOSIS — M25612 Stiffness of left shoulder, not elsewhere classified: Secondary | ICD-10-CM | POA: Diagnosis not present

## 2016-09-11 DIAGNOSIS — M542 Cervicalgia: Secondary | ICD-10-CM

## 2016-09-11 DIAGNOSIS — M6281 Muscle weakness (generalized): Secondary | ICD-10-CM

## 2016-09-11 NOTE — Therapy (Signed)
[facility] [address] [place], [place], [address] Phone: [phone]   Fax:  [phone]  Physical Therapy Treatment  Patient Details  Name: [name] [name] MRN: [id number] Date of Birth: 1960/10/16 Referring Provider: Annell Greening, MD  Encounter Date: 09/11/2016      PT End of Session - 09/11/16 1058    Visit Number 2   Number of Visits 12   Date for PT Re-Evaluation 10/15/16   PT Start Time 1030   PT Stop Time 1120   PT Time Calculation (min) 50 min      Past Medical History:  Diagnosis Date  . Anginal pain (HCC)    "EVERY SINGLE DAY" (05/04/2015)  . Arthritis    "fingers; ankles" (05/04/2015)  . COPD (chronic obstructive pulmonary disease) (HCC)   . Daily headache    "hopefully this neck OR will help" (05/04/2015)  . Depression   . Dyspnea    on exertion  . Exertional shortness of breath   . GERD (gastroesophageal reflux disease)   . History of gout   . History of hiatal hernia   . History of shingles   . Hypertension    has never been on meds  . Neck pain    buldging disc  . Nocturia   . PONV (postoperative nausea and vomiting)    "just one time"  . Presence of permanent cardiac pacemaker   . Syncopal episodes   . Tachy-brady syndrome (HCC)   . Vasovagal syncope    last one Jan 2017  . Weakness    numbness in left hand    Past Surgical History:  Procedure Laterality Date  . ANTERIOR CERVICAL DECOMP/DISCECTOMY FUSION  05/04/2015  . ANTERIOR CERVICAL DECOMP/DISCECTOMY FUSION N/A 05/04/2015   Procedure: C6-7 Anterior Cervical Discectomy and Fusion, Allograft, Plate ;  Surgeon: Eldred Manges, MD;  Location: [facility];  Service: Orthopedics;  Laterality: N/A;  . BACK SURGERY    . CARDIAC CATHETERIZATION     2014  . CARDIAC ELECTROPHYSIOLOGY MAPPING AND ABLATION    . CARDIAC ELECTROPHYSIOLOGY STUDY & DFT    . HERNIA REPAIR    . INSERT / REPLACE / REMOVE PACEMAKER     [facility].Dr.Tony Sharol Harness is cardiologist  . LOOP  RECORDER INSERTION  07/1999   [name] 07/05/2010  . LOOP RECORDER REMOVAL  07/2001   [name] 07/05/2010  . POSTERIOR CERVICAL FUSION/FORAMINOTOMY N/A 04/20/2016   Procedure: c6-7 posterior fusion, ILIAC BONE MARROW ASPIRATE, WIRING;  Surgeon: Eldred Manges, MD;  Location: [facility] OR;  Service: Orthopedics;  Laterality: N/A;  . SHOULDER ARTHROSCOPY W/ ROTATOR CUFF REPAIR Right   . UMBILICAL HERNIA REPAIR      There were no vitals filed for this visit.      Subjective Assessment - 09/11/16 1037    Subjective Patient reports that about 5 weeks ago he started having difficulty lifting his arm overhead.    Pertinent History PACEMAKER; cervical fusion march 2018   Currently in Pain? Yes   Pain Score 4    Pain Orientation Left   Pain Onset More than a month ago   Pain Frequency Intermittent                         [facility] Adult PT Treatment/Exercise - 09/11/16 0001      Modalities   Modalities Moist Heat;Ultrasound;Vasopneumatic     Ultrasound   Ultrasound Location 1.5 w/cm2 x 10 mins to LT shldr iposteriolateral aspect  Ultrasound Goals Pain     Vasopneumatic   Number Minutes Vasopneumatic  15 minutes   Vasopnuematic Location  Shoulder   Vasopneumatic Pressure Medium   Vasopneumatic Temperature  36     Manual Therapy   Manual Therapy Soft tissue mobilization;Passive ROM   Soft tissue mobilization IASTM and TPR to LT shldr, Levator scap and UT with good releases                     PT Long Term Goals - 09/03/16 1231      PT LONG TERM GOAL #1   Title I with HEP   Time 6   Period Weeks   Status New     PT LONG TERM GOAL #2   Title Patient able to demonstrate L shoulder flexion to 155 deg or better with good mechanics to normalize ADLS.   Time 6   Period Weeks   Status New     PT LONG TERM GOAL #3   Title Patient to demo 4+/5 L shoulder strength or better to normalize mechanics.   Time 6   Period Weeks   Status New     PT LONG TERM GOAL #4    Title Patient able to perform ADLs with L shoulder with 4/092/10 pain or less.   Time 6   Period Weeks   Status New     PT LONG TERM GOAL #5   Title Patient able to turn head to left without pain.   Time 6   Period Weeks   Status New               Plan - 09/11/16 1058    Clinical Impression Statement Pt arrived to clinic today very sore LT shldr and around UT/levator scap. He responded well to US/ STW/TPR to these areas with good release to UT and levator.   Rehab Potential Excellent   PT Frequency 2x / week   PT Duration 6 weeks   PT Treatment/Interventions ADLs/Self Care Home Management;Cryotherapy;Moist Heat;Ultrasound;Therapeutic exercise;Neuromuscular re-education;Passive range of motion;Manual techniques;Patient/family education;Taping;Vasopneumatic Device   PT Next Visit Plan STW to L scalenes, Lev Scap, post cuff muscles; strengthening L shoulder and scapular stabilizers; NO ESTIM DUE TO PACEMAKER; modalities prn.   US over Levator   PT Home Exercise Plan cervical tension stretches, scap retraction, corner stretch      Patient will benefit from skilled therapeutic intervention in order to improve the following deficits and impairments:  Decreased strength, Pain, Impaired UE functional use, Decreased range of motion, Postural dysfunction  Visit Diagnosis: Stiffness of left shoulder, not elsewhere classified  Muscle weakness (generalized)  Cervicalgia  Acute pain of left shoulder     Problem List Patient Active Problem List   Diagnosis Date Noted  . Cervical pseudoarthrosis (HCC) 04/20/2016  . Spondylosis, cervical 05/05/2015  . S/P cervical spinal fusion 05/04/2015  . NONSPECIFIC ABNORMAL UNSPEC CV FUNCTION STUDY 04/06/2009  . CHEST PAIN UNSPECIFIED 01/20/2009  . Syncope and collapse 01/12/2009    Spero Gunnels,CHRIS, PTA 09/11/2016, 11:29 AM  [facility] [address] [place], [place], [address] Phone: [phone]    Fax:  [phone]  Name: [name] [name] MRN: [id number] Date of Birth: 08/18/1960

## 2016-09-13 ENCOUNTER — Encounter: Payer: Medicare HMO | Admitting: Physical Therapy

## 2016-09-18 ENCOUNTER — Ambulatory Visit: Payer: Medicare HMO | Admitting: Physical Therapy

## 2016-09-18 ENCOUNTER — Encounter: Payer: Self-pay | Admitting: Physical Therapy

## 2016-09-18 DIAGNOSIS — M542 Cervicalgia: Secondary | ICD-10-CM

## 2016-09-18 DIAGNOSIS — M6281 Muscle weakness (generalized): Secondary | ICD-10-CM

## 2016-09-18 DIAGNOSIS — M25512 Pain in left shoulder: Secondary | ICD-10-CM

## 2016-09-18 DIAGNOSIS — M25612 Stiffness of left shoulder, not elsewhere classified: Secondary | ICD-10-CM

## 2016-09-18 NOTE — Therapy (Signed)
[facility] [address] [place], [place], [address] Phone: [phone]   Fax:  [phone]  Physical Therapy Treatment  Patient Details  Name: DELWYN SCOGGIN MRN: [id number] Date of Birth: 1961-01-10 Referring Provider: Annell Greening, MD  Encounter Date: 09/18/2016      PT End of Session - 09/18/16 1421    Visit Number 3   Number of Visits 12   Date for PT Re-Evaluation 10/15/16   PT Start Time 1428   PT Stop Time 1513   PT Time Calculation (min) 45 min   Activity Tolerance Patient tolerated treatment well   Behavior During Therapy [facility] for tasks assessed/performed      Past Medical History:  Diagnosis Date  . Anginal pain (HCC)    "EVERY SINGLE DAY" (05/04/2015)  . Arthritis    "fingers; ankles" (05/04/2015)  . COPD (chronic obstructive pulmonary disease) (HCC)   . Daily headache    "hopefully this neck OR will help" (05/04/2015)  . Depression   . Dyspnea    on exertion  . Exertional shortness of breath   . GERD (gastroesophageal reflux disease)   . History of gout   . History of hiatal hernia   . History of shingles   . Hypertension    has never been on meds  . Neck pain    buldging disc  . Nocturia   . PONV (postoperative nausea and vomiting)    "just one time"  . Presence of permanent cardiac pacemaker   . Syncopal episodes   . Tachy-brady syndrome (HCC)   . Vasovagal syncope    last one Jan 2017  . Weakness    numbness in left hand    Past Surgical History:  Procedure Laterality Date  . ANTERIOR CERVICAL DECOMP/DISCECTOMY FUSION  05/04/2015  . ANTERIOR CERVICAL DECOMP/DISCECTOMY FUSION N/A 05/04/2015   Procedure: C6-7 Anterior Cervical Discectomy and Fusion, Allograft, Plate ;  Surgeon: Eldred Manges, MD;  [facility]: [facility];  Service: Orthopedics;  Laterality: N/A;  . BACK SURGERY    . CARDIAC CATHETERIZATION     2014  . CARDIAC ELECTROPHYSIOLOGY MAPPING AND ABLATION    . CARDIAC ELECTROPHYSIOLOGY STUDY & DFT     . HERNIA REPAIR    . INSERT / REPLACE / REMOVE PACEMAKER     [facility].Dr.Tony Sharol Harness is cardiologist  . LOOP RECORDER INSERTION  07/1999   [name] 07/05/2010  . LOOP RECORDER REMOVAL  07/2001   [name] 07/05/2010  . POSTERIOR CERVICAL FUSION/FORAMINOTOMY N/A 04/20/2016   Procedure: c6-7 posterior fusion, ILIAC BONE MARROW ASPIRATE, WIRING;  Surgeon: Eldred Manges, MD;  Location: [facility];  Service: Orthopedics;  Laterality: N/A;  . SHOULDER ARTHROSCOPY W/ ROTATOR CUFF REPAIR Right   . UMBILICAL HERNIA REPAIR      There were no vitals filed for this visit.      Subjective Assessment - 09/18/16 1421    Subjective Patient reports soreness but thinks that may be attributed to rainy.   Pertinent History PACEMAKER; cervical fusion march 2018   Currently in Pain? Yes   Pain Score 4    Pain Location Shoulder   Pain Orientation Left   Pain Descriptors / Indicators Sore   Pain Type Acute pain   Pain Onset More than a month ago   Pain Frequency Constant            [facility] PT Assessment - 09/18/16 0001      Assessment   Medical Diagnosis NON TRAUMATIC TEAR OF L  rc   Onset Date/Surgical Date 07/30/16   Next MD Visit After PT but none scheduled     Precautions   Precautions ICD/Pacemaker   Precaution Comments NO ESTIM; recent cervical fusion                     [facility] Adult PT Treatment/Exercise - 09/18/16 0001      Modalities   Modalities Ultrasound;Vasopneumatic     Ultrasound   Ultrasound Location L posteriolateral shoulder   Ultrasound Parameters 1.5 w/cm2, 100%, 1 mhz x10 min   Ultrasound Goals Pain     Vasopneumatic   Number Minutes Vasopneumatic  15 minutes   Vasopnuematic Location  Shoulder   Vasopneumatic Pressure Low   Vasopneumatic Temperature  54     Manual Therapy   Manual Therapy Soft tissue mobilization   Soft tissue mobilization STW to L posterior cuff, deltoids, UT, scalenes to reduce tone and pain                     PT Long Term  Goals - 09/03/16 1231      PT LONG TERM GOAL #1   Title I with HEP   Time 6   Period Weeks   Status New     PT LONG TERM GOAL #2   Title Patient able to demonstrate L shoulder flexion to 155 deg or better with good mechanics to normalize ADLS.   Time 6   Period Weeks   Status New     PT LONG TERM GOAL #3   Title Patient to demo 4+/5 L shoulder strength or better to normalize mechanics.   Time 6   Period Weeks   Status New     PT LONG TERM GOAL #4   Title Patient able to perform ADLs with L shoulder with 1/612/10 pain or less.   Time 6   Period Weeks   Status New     PT LONG TERM GOAL #5   Title Patient able to turn head to left without pain.   Time 6   Period Weeks   Status New               Plan - 09/18/16 1514    Clinical Impression Statement Patient arrived today with complaints of soreness in L shoulder which he attributed to damp, wet weather. Patient tolerated US well although he could feel soreness over L posteriolateral shoulder with passage of US head and during STW. Tightness still palpated in L UT and scalenes as well as deltoids. Normal modalities response noted following removal of the modalities.   Rehab Potential Excellent   PT Frequency 2x / week   PT Duration 6 weeks   PT Treatment/Interventions ADLs/Self Care Home Management;Cryotherapy;Moist Heat;Ultrasound;Therapeutic exercise;Neuromuscular re-education;Passive range of motion;Manual techniques;Patient/family education;Taping;Vasopneumatic Device   PT Next Visit Plan STW to L scalenes, Lev Scap, post cuff muscles; strengthening L shoulder and scapular stabilizers; NO ESTIM DUE TO PACEMAKER; modalities prn.   US over Levator   PT Home Exercise Plan cervical tension stretches, scap retraction, corner stretch   Consulted and Agree with Plan of Care Patient      Patient will benefit from skilled therapeutic intervention in order to improve the following deficits and impairments:  Decreased strength,  Pain, Impaired UE functional use, Decreased range of motion, Postural dysfunction  Visit Diagnosis: Stiffness of left shoulder, not elsewhere classified  Muscle weakness (generalized)  Cervicalgia  Acute pain of left shoulder  Problem List Patient Active Problem List   Diagnosis Date Noted  . Cervical pseudoarthrosis (HCC) 04/20/2016  . Spondylosis, cervical 05/05/2015  . S/P cervical spinal fusion 05/04/2015  . NONSPECIFIC ABNORMAL UNSPEC CV FUNCTION STUDY 04/06/2009  . CHEST PAIN UNSPECIFIED 01/20/2009  . Syncope and collapse 01/12/2009    Evelene CroonKelsey M Parsons, PTA 09/18/2016, 3:24 PM  [facility] [address] [place], [place], [address] Phone: [phone]   Fax:  [phone]  Name: Katherina RightDanny L Morro MRN: [id number] Date of Birth: 01/30/1961

## 2016-09-20 ENCOUNTER — Encounter: Payer: Self-pay | Admitting: Physical Therapy

## 2016-09-20 ENCOUNTER — Ambulatory Visit: Payer: Medicare HMO | Attending: Orthopaedic Surgery | Admitting: Physical Therapy

## 2016-09-20 DIAGNOSIS — M542 Cervicalgia: Secondary | ICD-10-CM | POA: Insufficient documentation

## 2016-09-20 DIAGNOSIS — M25512 Pain in left shoulder: Secondary | ICD-10-CM | POA: Diagnosis present

## 2016-09-20 DIAGNOSIS — M25612 Stiffness of left shoulder, not elsewhere classified: Secondary | ICD-10-CM | POA: Diagnosis present

## 2016-09-20 DIAGNOSIS — M6281 Muscle weakness (generalized): Secondary | ICD-10-CM | POA: Insufficient documentation

## 2016-09-20 NOTE — Therapy (Signed)
[facility] [address] [place], [place], [address] Phone: [phone]   Fax:  [phone]  Physical Therapy Treatment  Patient Details  Name: [name] [name] MRN: [id number] Date of Birth: 10/16/1960 Referring Provider: Annell GreeningMark Yates, MD  Encounter Date: 09/20/2016      PT End of Session - 09/20/16 1508    Visit Number 4   Number of Visits 12   Date for PT Re-Evaluation 10/15/16   PT Start Time 1430   PT Stop Time 1520   PT Time Calculation (min) 50 min   Activity Tolerance Patient tolerated treatment well   Behavior During Therapy [facility] for tasks assessed/performed      Past Medical History:  Diagnosis Date  . Anginal pain (HCC)    "EVERY SINGLE DAY" (05/04/2015)  . Arthritis    "fingers; ankles" (05/04/2015)  . COPD (chronic obstructive pulmonary disease) (HCC)   . Daily headache    "hopefully this neck OR will help" (05/04/2015)  . Depression   . Dyspnea    on exertion  . Exertional shortness of breath   . GERD (gastroesophageal reflux disease)   . History of gout   . History of hiatal hernia   . History of shingles   . Hypertension    has never been on meds  . Neck pain    buldging disc  . Nocturia   . PONV (postoperative nausea and vomiting)    "just one time"  . Presence of permanent cardiac pacemaker   . Syncopal episodes   . Tachy-brady syndrome (HCC)   . Vasovagal syncope    last one Jan 2017  . Weakness    numbness in left hand    Past Surgical History:  Procedure Laterality Date  . ANTERIOR CERVICAL DECOMP/DISCECTOMY FUSION  05/04/2015  . ANTERIOR CERVICAL DECOMP/DISCECTOMY FUSION N/A 05/04/2015   Procedure: C6-7 Anterior Cervical Discectomy and Fusion, Allograft, Plate ;  Surgeon: Eldred MangesMark C Yates, MD;  Location: [facility] OR;  Service: Orthopedics;  Laterality: N/A;  . BACK SURGERY    . CARDIAC CATHETERIZATION     2014  . CARDIAC ELECTROPHYSIOLOGY MAPPING AND ABLATION    . CARDIAC ELECTROPHYSIOLOGY STUDY & DFT     . HERNIA REPAIR    . INSERT / REPLACE / REMOVE PACEMAKER     [facility].Dr.Tony Sharol HarnessSimmons is cardiologist  . LOOP RECORDER INSERTION  07/1999   [name]/notes 07/05/2010  . LOOP RECORDER REMOVAL  07/2001   [name]/notes 07/05/2010  . POSTERIOR CERVICAL FUSION/FORAMINOTOMY N/A 04/20/2016   Procedure: c6-7 posterior fusion, ILIAC BONE MARROW ASPIRATE, WIRING;  Surgeon: Eldred MangesMark C Yates, MD;  Location: [facility] OR;  Service: Orthopedics;  Laterality: N/A;  . SHOULDER ARTHROSCOPY W/ ROTATOR CUFF REPAIR Right   . UMBILICAL HERNIA REPAIR      There were no vitals filed for this visit.      Subjective Assessment - 09/20/16 1437    Subjective Patient reported ongoing pain and temporary relief   Pertinent History PACEMAKER; cervical fusion march 2018   Currently in Pain? Yes   Pain Score 4    Pain Location Shoulder   Pain Orientation Left   Pain Descriptors / Indicators Sore   Pain Type Acute pain   Pain Onset More than a month ago   Aggravating Factors  turning head and lifting head   Pain Relieving Factors meds                         [facility]  Adult PT Treatment/Exercise - 09/20/16 0001      Ultrasound   Ultrasound Location L posteriolateral shoulder   Ultrasound Parameters 1.5w/cm2/50%/321mhz x2212min     Vasopneumatic   Number Minutes Vasopneumatic  15 minutes   Vasopnuematic Location  Shoulder   Vasopneumatic Pressure Low     Manual Therapy   Manual Therapy Soft tissue mobilization   Soft tissue mobilization STW to L posterior cuff, deltoids, UT, scalenes to reduce tone and pain                     PT Long Term Goals - 09/20/16 1511      PT LONG TERM GOAL #1   Title I with HEP   Time 6   Period Weeks   Status On-going     PT LONG TERM GOAL #2   Title Patient able to demonstrate L shoulder flexion to 155 deg or better with good mechanics to normalize ADLS.   Time 6   Period Weeks   Status On-going     PT LONG TERM GOAL #3   Title Patient to demo 4+/5 L shoulder strength  or better to normalize mechanics.   Time 6   Period Weeks   Status On-going     PT LONG TERM GOAL #4   Title Patient able to perform ADLs with L shoulder with 1/612/10 pain or less.   Time 6   Period Weeks   Status On-going     PT LONG TERM GOAL #5   Title Patient able to turn head to left without pain.   Time 6   Period Weeks   Status On-going               Plan - 09/20/16 1511    Clinical Impression Statement Patient reported doing well with each treatment yet temporary relief thus far. Patient has ongoing complaints of pain with cervial rotation and UE movement. Patient has palpable painleft UT/and scalenes. Current goals ongoing due to pain deficts.    Rehab Potential Excellent   PT Frequency 2x / week   PT Duration 6 weeks   PT Treatment/Interventions ADLs/Self Care Home Management;Cryotherapy;Moist Heat;Ultrasound;Therapeutic exercise;Neuromuscular re-education;Passive range of motion;Manual techniques;Patient/family education;Taping;Vasopneumatic Device   PT Next Visit Plan STW to L scalenes, Lev Scap, post cuff muscles; strengthening L shoulder and scapular stabilizers; NO ESTIM DUE TO PACEMAKER; modalities prn.   US over Levator   Consulted and Agree with Plan of Care Patient      Patient will benefit from skilled therapeutic intervention in order to improve the following deficits and impairments:  Decreased strength, Pain, Impaired UE functional use, Decreased range of motion, Postural dysfunction  Visit Diagnosis: Stiffness of left shoulder, not elsewhere classified  Muscle weakness (generalized)  Cervicalgia  Acute pain of left shoulder     Problem List Patient Active Problem List   Diagnosis Date Noted  . Cervical pseudoarthrosis (HCC) 04/20/2016  . Spondylosis, cervical 05/05/2015  . S/P cervical spinal fusion 05/04/2015  . NONSPECIFIC ABNORMAL UNSPEC CV FUNCTION STUDY 04/06/2009  . CHEST PAIN UNSPECIFIED 01/20/2009  . Syncope and collapse  01/12/2009    Christia Domke P, PTA 09/20/2016, 3:23 PM  [facility] [address] [place], [place], [address] Phone: [phone]   Fax:  [phone]  Name: [name] [name] MRN: [id number] Date of Birth: 11/06/1960

## 2016-09-24 ENCOUNTER — Encounter: Payer: Self-pay | Admitting: Physical Therapy

## 2016-09-24 ENCOUNTER — Ambulatory Visit: Payer: Medicare HMO | Admitting: Physical Therapy

## 2016-09-24 DIAGNOSIS — M25512 Pain in left shoulder: Secondary | ICD-10-CM

## 2016-09-24 DIAGNOSIS — M542 Cervicalgia: Secondary | ICD-10-CM

## 2016-09-24 DIAGNOSIS — M6281 Muscle weakness (generalized): Secondary | ICD-10-CM

## 2016-09-24 DIAGNOSIS — M25612 Stiffness of left shoulder, not elsewhere classified: Secondary | ICD-10-CM
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
## 2016-09-27 ENCOUNTER — Encounter: Payer: Self-pay | Admitting: Physical Therapy

## 2016-09-27 ENCOUNTER — Ambulatory Visit: Payer: Medicare HMO | Admitting: Physical Therapy

## 2016-09-27 DIAGNOSIS — M6281 Muscle weakness (generalized): Secondary | ICD-10-CM

## 2016-09-27 DIAGNOSIS — M25512 Pain in left shoulder: Secondary | ICD-10-CM

## 2016-09-27 DIAGNOSIS — M25612 Stiffness of left shoulder, not elsewhere classified: Secondary | ICD-10-CM

## 2016-09-27 DIAGNOSIS — M542 Cervicalgia: Secondary | ICD-10-CM

## 2016-09-27 NOTE — Therapy (Signed)
[facility] [address] [place], [place], [address] Phone: [phone]   Fax:  [phone]  Physical Therapy Treatment  Patient Details  Name: [name] [name] MRN: [id number] Date of Birth: 10/18/1960 Referring Provider: Annell Greening, MD  Encounter Date: 09/27/2016      PT End of Session - 09/27/16 1548    Visit Number 6   Number of Visits 12   Date for PT Re-Evaluation 10/15/16   PT Start Time 1513   PT Stop Time 1600   PT Time Calculation (min) 47 min   Activity Tolerance Patient tolerated treatment well   Behavior During Therapy [facility] for tasks assessed/performed      Past Medical History:  Diagnosis Date  . Anginal pain (HCC)    "EVERY SINGLE DAY" (05/04/2015)  . Arthritis    "fingers; ankles" (05/04/2015)  . COPD (chronic obstructive pulmonary disease) (HCC)   . Daily headache    "hopefully this neck OR will help" (05/04/2015)  . Depression   . Dyspnea    on exertion  . Exertional shortness of breath   . GERD (gastroesophageal reflux disease)   . History of gout   . History of hiatal hernia   . History of shingles   . Hypertension    has never been on meds  . Neck pain    buldging disc  . Nocturia   . PONV (postoperative nausea and vomiting)    "just one time"  . Presence of permanent cardiac pacemaker   . Syncopal episodes   . Tachy-brady syndrome (HCC)   . Vasovagal syncope    last one Jan 2017  . Weakness    numbness in left hand    Past Surgical History:  Procedure Laterality Date  . ANTERIOR CERVICAL DECOMP/DISCECTOMY FUSION  05/04/2015  . ANTERIOR CERVICAL DECOMP/DISCECTOMY FUSION N/A 05/04/2015   Procedure: C6-7 Anterior Cervical Discectomy and Fusion, Allograft, Plate ;  Surgeon: Eldred Manges, MD;  Location: [facility] OR;  Service: Orthopedics;  Laterality: N/A;  . BACK SURGERY    . CARDIAC CATHETERIZATION     2014  . CARDIAC ELECTROPHYSIOLOGY MAPPING AND ABLATION    . CARDIAC ELECTROPHYSIOLOGY STUDY & DFT     . HERNIA REPAIR    . INSERT / REPLACE / REMOVE PACEMAKER     [facility].Dr.Tony Sharol Harness is cardiologist  . LOOP RECORDER INSERTION  07/1999   [name] 07/05/2010  . LOOP RECORDER REMOVAL  07/2001   [name] 07/05/2010  . POSTERIOR CERVICAL FUSION/FORAMINOTOMY N/A 04/20/2016   Procedure: c6-7 posterior fusion, ILIAC BONE MARROW ASPIRATE, WIRING;  Surgeon: Eldred Manges, MD;  Location: [facility];  Service: Orthopedics;  Laterality: N/A;  . SHOULDER ARTHROSCOPY W/ ROTATOR CUFF REPAIR Right   . UMBILICAL HERNIA REPAIR      There were no vitals filed for this visit.      Subjective Assessment - 09/27/16 1518    Subjective Patient reported feeling about the same   Pertinent History PACEMAKER; cervical fusion march 2018   Currently in Pain? Yes   Pain Score 4    Pain Location Shoulder   Pain Orientation Left   Pain Descriptors / Indicators Sore   Pain Type Acute pain   Pain Onset More than a month ago   Pain Frequency Constant   Aggravating Factors  prolong activity   Pain Relieving Factors meds  [facility] Adult PT Treatment/Exercise - 09/27/16 0001      Shoulder Exercises: Prone   Retraction Strengthening;Left;Weights  3x10 kneeling   Retraction Weight (lbs) 2   Extension Strengthening;Left;Weights  3x10   Extension Weight (lbs) 2     Shoulder Exercises: Standing   Protraction Strengthening;Left;Theraband  3x10   Theraband Level (Shoulder Protraction) Level 1 (Yellow)   External Rotation Strengthening;Left;Theraband  3x10   Theraband Level (Shoulder External Rotation) Level 1 (Yellow)   Internal Rotation Strengthening;Left;Theraband  3x10   Theraband Level (Shoulder Internal Rotation) Level 1 (Yellow)   Flexion Strengthening;Left;AROM  3x10   Row Strengthening;Left;Theraband  3x10   Theraband Level (Shoulder Row) Level 1 (Yellow)   Other Standing Exercises scap retractions with green t-band 3x10   Other Standing Exercises scaption AROM to 90  degrees 3x10     Shoulder Exercises: Pulleys   Flexion Other (comment)      Vasopneumatic   Number Minutes Vasopneumatic  15 minutes   Vasopnuematic Location  Shoulder   Vasopneumatic Pressure Low     Manual Therapy   Manual Therapy Soft tissue mobilization   Soft tissue mobilization STW to L posterior cuff, deltoids, UT, scalenes to reduce tone and pain                     PT Long Term Goals - 09/20/16 1511      PT LONG TERM GOAL #1   Title I with HEP   Time 6   Period Weeks   Status On-going     PT LONG TERM GOAL #2   Title Patient able to demonstrate L shoulder flexion to 155 deg or better with good mechanics to normalize ADLS.   Time 6   Period Weeks   Status On-going     PT LONG TERM GOAL #3   Title Patient to demo 4+/5 L shoulder strength or better to normalize mechanics.   Time 6   Period Weeks   Status On-going     PT LONG TERM GOAL #4   Title Patient able to perform ADLs with L shoulder with 1/30 pain or less.   Time 6   Period Weeks   Status On-going     PT LONG TERM GOAL #5   Title Patient able to turn head to left without pain.   Time 6   Period Weeks   Status On-going               Plan - 09/27/16 1550    Clinical Impression Statement Patient tolerated treatment well today. Patient able to progress with strengthening exercises today. Patient has reported temporary relief thus far. Patient current goals ongoing due to pain and ROM deficts.    Rehab Potential Excellent   PT Frequency 2x / week   PT Duration 6 weeks   PT Treatment/Interventions ADLs/Self Care Home Management;Cryotherapy;Moist Heat;Ultrasound;Therapeutic exercise;Neuromuscular re-education;Passive range of motion;Manual techniques;Patient/family education;Taping;Vasopneumatic Device   PT Next Visit Plan STW to L scalenes, Lev Scap, post cuff muscles; strengthening L shoulder and scapular stabilizers; NO ESTIM DUE TO PACEMAKER; modalities prn.   [place] over Levator  (no follow up with MD until after therapy)   Consulted and Agree with Plan of Care Patient      Patient will benefit from skilled therapeutic intervention in order to improve the following deficits and impairments:  Decreased strength, Pain, Impaired UE functional use, Decreased range of motion, Postural dysfunction  Visit Diagnosis: Stiffness of left shoulder, not elsewhere classified  Muscle weakness (generalized)  Cervicalgia  Acute pain of left shoulder     Problem List Patient Active Problem List   Diagnosis Date Noted  . Cervical pseudoarthrosis (HCC) 04/20/2016  . Spondylosis, cervical 05/05/2015  . S/P cervical spinal fusion 05/04/2015  . NONSPECIFIC ABNORMAL UNSPEC CV FUNCTION STUDY 04/06/2009  . CHEST PAIN UNSPECIFIED 01/20/2009  . Syncope and collapse 01/12/2009    Freida Nebel P, PTA 09/27/2016, 4:02 PM  [facility] [address] [place], [place], [address] Phone: [phone]   Fax:  [phone]  Name: [name] [name] MRN: [id number] Date of Birth: 08/03/1960

## 2016-10-03 ENCOUNTER — Encounter: Payer: Self-pay | Admitting: Physical Therapy

## 2016-10-03 ENCOUNTER — Ambulatory Visit: Payer: Medicare HMO | Admitting: Physical Therapy

## 2016-10-03 DIAGNOSIS — M542 Cervicalgia: Secondary | ICD-10-CM

## 2016-10-03 DIAGNOSIS — M6281 Muscle weakness (generalized): Secondary | ICD-10-CM

## 2016-10-03 DIAGNOSIS — M25512 Pain in left shoulder: Secondary | ICD-10-CM

## 2016-10-03 DIAGNOSIS — M25612 Stiffness of left shoulder, not elsewhere classified: Secondary | ICD-10-CM | POA: Diagnosis not present

## 2016-10-03 NOTE — Therapy (Signed)
[facility] [address] [place], [place], [address] Phone: [phone]   Fax:  [phone]  Physical Therapy Treatment  Patient Details  Name: [name] RightDanny L Lorek MRN: [id number] Date of Birth: 06/15/1960 Referring Provider: Annell GreeningMark Yates, MD  Encounter Date: 10/03/2016      PT End of Session - 10/03/16 1231    Visit Number 7   Number of Visits 12   Date for PT Re-Evaluation 10/15/16   PT Start Time 1200   PT Stop Time 1244   PT Time Calculation (min) 44 min   Activity Tolerance Patient tolerated treatment well   Behavior During Therapy [facility] for tasks assessed/performed      Past Medical History:  Diagnosis Date  . Anginal pain (HCC)    "EVERY SINGLE DAY" (05/04/2015)  . Arthritis    "fingers; ankles" (05/04/2015)  . COPD (chronic obstructive pulmonary disease) (HCC)   . Daily headache    "hopefully this neck OR will help" (05/04/2015)  . Depression   . Dyspnea    on exertion  . Exertional shortness of breath   . GERD (gastroesophageal reflux disease)   . History of gout   . History of hiatal hernia   . History of shingles   . Hypertension    has never been on meds  . Neck pain    buldging disc  . Nocturia   . PONV (postoperative nausea and vomiting)    "just one time"  . Presence of permanent cardiac pacemaker   . Syncopal episodes   . Tachy-brady syndrome (HCC)   . Vasovagal syncope    last one Jan 2017  . Weakness    numbness in left hand    Past Surgical History:  Procedure Laterality Date  . ANTERIOR CERVICAL DECOMP/DISCECTOMY FUSION  05/04/2015  . ANTERIOR CERVICAL DECOMP/DISCECTOMY FUSION N/A 05/04/2015   Procedure: C6-7 Anterior Cervical Discectomy and Fusion, Allograft, Plate ;  Surgeon: Eldred MangesMark C Yates, MD;  Location: [facility] OR;  Service: Orthopedics;  Laterality: N/A;  . BACK SURGERY    . CARDIAC CATHETERIZATION     2014  . CARDIAC ELECTROPHYSIOLOGY MAPPING AND ABLATION    . CARDIAC ELECTROPHYSIOLOGY STUDY & DFT     . HERNIA REPAIR    . INSERT / REPLACE / REMOVE PACEMAKER     [facility].Dr.Tony Sharol HarnessSimmons is cardiologist  . LOOP RECORDER INSERTION  07/1999   [name]/notes 07/05/2010  . LOOP RECORDER REMOVAL  07/2001   [name]/notes 07/05/2010  . POSTERIOR CERVICAL FUSION/FORAMINOTOMY N/A 04/20/2016   Procedure: c6-7 posterior fusion, ILIAC BONE MARROW ASPIRATE, WIRING;  Surgeon: Eldred MangesMark C Yates, MD;  Location: [facility] OR;  Service: Orthopedics;  Laterality: N/A;  . SHOULDER ARTHROSCOPY W/ ROTATOR CUFF REPAIR [name]   . UMBILICAL HERNIA REPAIR      There were no vitals filed for this visit.      Subjective Assessment - 10/03/16 1201    Subjective Patient reported feeling about the same   Pertinent History PACEMAKER; cervical fusion march 2018   Currently in Pain? Yes   Pain Score 4    Pain Location Shoulder   Pain Orientation Left   Pain Descriptors / Indicators Sore   Pain Type Acute pain   Pain Onset More than a month ago   Pain Frequency Constant   Aggravating Factors  prolong activity   Pain Relieving Factors meds  [facility] Adult PT Treatment/Exercise - 10/03/16 0001      Shoulder Exercises: Prone   Retraction Strengthening;Left;Weights  3x10 kneeling   Retraction Weight (lbs) 2   Extension Strengthening;Left;Weights  3x10 kneeling   Extension Weight (lbs) 2     Shoulder Exercises: Sidelying   External Rotation Strengthening;Left;Weights   External Rotation Weight (lbs) 2   External Rotation Limitations 3x10     Shoulder Exercises: Standing   Protraction Strengthening;Left;Theraband  3x10   Theraband Level (Shoulder Protraction) Level 2 (Red)   External Rotation Strengthening;Left;Theraband  3x10   Theraband Level (Shoulder External Rotation) Level 2 (Red)   Internal Rotation Strengthening;Left;Theraband  3x10   Theraband Level (Shoulder Internal Rotation) Level 2 (Red)   Flexion Strengthening;Left;AROM  3x10 1#   Row Strengthening;Left;Theraband  3x10    Theraband Level (Shoulder Row) Level 2 (Red)   Retraction Strengthening;Both;20 reps  pink XTS   Other Standing Exercises scaption 1#AROM to 90 degrees 3x10     Shoulder Exercises: Pulleys   Flexion 3 minutes     Vasopneumatic   Number Minutes Vasopneumatic  15 minutes   Vasopnuematic Location  Shoulder   Vasopneumatic Pressure Low                PT Education - 10/03/16 1208    Education provided Yes   Education Details HEP   Person(s) Educated Patient   Methods Explanation;Demonstration;Handout   Comprehension Verbalized understanding;Returned demonstration             PT Long Term Goals - 09/20/16 1511      PT LONG TERM GOAL #1   Title I with HEP   Time 6   Period Weeks   Status On-going     PT LONG TERM GOAL #2   Title Patient able to demonstrate L shoulder flexion to 155 deg or better with good mechanics to normalize ADLS.   Time 6   Period Weeks   Status On-going     PT LONG TERM GOAL #3   Title Patient to demo 4+/5 L shoulder strength or better to normalize mechanics.   Time 6   Period Weeks   Status On-going     PT LONG TERM GOAL #4   Title Patient able to perform ADLs with L shoulder with 1/61 pain or less.   Time 6   Period Weeks   Status On-going     PT LONG TERM GOAL #5   Title Patient able to turn head to left without pain.   Time 6   Period Weeks   Status On-going               Plan - 10/03/16 1233    Clinical Impression Statement Patient tolerated treatment well today. Patient reported no long lasting relief thus far. Patient progressing with left shoulder strengtheing. HEP issued today with yellow t-band. Current goals ongoing due to ROM and strength deficts.    Rehab Potential Excellent   PT Frequency 2x / week   PT Duration 6 weeks   PT Treatment/Interventions ADLs/Self Care Home Management;Cryotherapy;Moist Heat;Ultrasound;Therapeutic exercise;Neuromuscular re-education;Passive range of motion;Manual  techniques;Patient/family education;Taping;Vasopneumatic Device   PT Next Visit Plan cont with strengthening L shoulder and scapular stabilizers; NO ESTIM DUE TO PACEMAKER;STW/modalities prn.   [place] over Levator (no follow up with MD until after therapy)   Consulted and Agree with Plan of Care Patient      Patient will benefit from skilled therapeutic intervention in order to improve the following deficits and impairments:  Decreased strength, Pain, Impaired UE functional use, Decreased range of motion, Postural dysfunction  Visit Diagnosis: Stiffness of left shoulder, not elsewhere classified  Muscle weakness (generalized)  Cervicalgia  Acute pain of left shoulder     Problem List Patient Active Problem List   Diagnosis Date Noted  . Cervical pseudoarthrosis (HCC) 04/20/2016  . Spondylosis, cervical 05/05/2015  . S/P cervical spinal fusion 05/04/2015  . NONSPECIFIC ABNORMAL UNSPEC CV FUNCTION STUDY 04/06/2009  . CHEST PAIN UNSPECIFIED 01/20/2009  . Syncope and collapse 01/12/2009    Hermelinda Dellen, PTA 10/03/2016, 12:44 PM  [facility] [address] [place], [place], [address] Phone: [phone]   Fax:  [phone]  Name: JA OHMAN MRN: [id number] Date of Birth: 09-27-1960

## 2016-10-03 NOTE — Patient Instructions (Signed)
  Strengthening: Resisted Flexion   Hold tubing with left arm at side. Pull forward and up. Move shoulder through pain-free range of motion. Repeat __10__ times per set. Do _2-3___ sets per session. Do _2-3___ sessions per day.  Strengthening: Resisted Extension   Hold tubing in left hand, arm forward. Pull arm back, elbow straight. Repeat __10__ times per set. Do _2-3___ sets per session. Do _2-3___ sessions per day.   Strengthening: Resisted Internal Rotation   Hold tubing in left hand, elbow at side and forearm out. Rotate forearm in across body. Repeat __10__ times per set. Do _2-3___ sets per session. Do _2-3___ sessions per day.   Strengthening: Resisted External Rotation   Hold tubing in left hand, elbow at side and forearm across body. Rotate forearm out. Repeat __10__ times per set. Do __2-3__ sets per session. Do ____ sessions per day.    

## 2016-10-04 ENCOUNTER — Ambulatory Visit: Payer: Medicare HMO | Admitting: Physical Therapy

## 2016-10-08 ENCOUNTER — Encounter: Payer: Medicare HMO | Admitting: Physical Therapy

## 2016-10-10 ENCOUNTER — Encounter: Payer: Self-pay | Admitting: Physical Therapy

## 2016-10-10 ENCOUNTER — Ambulatory Visit: Payer: Medicare HMO | Admitting: Physical Therapy

## 2016-10-10 DIAGNOSIS — M25612 Stiffness of left shoulder, not elsewhere classified: Secondary | ICD-10-CM

## 2016-10-10 DIAGNOSIS — M542 Cervicalgia: Secondary | ICD-10-CM

## 2016-10-10 DIAGNOSIS — M6281 Muscle weakness (generalized): Secondary | ICD-10-CM

## 2016-10-10 DIAGNOSIS — M25512 Pain in left shoulder: Secondary | ICD-10-CM

## 2016-10-10 NOTE — Therapy (Signed)
[facility] [place], [place], [address] Phone: [phone]   Fax:  [phone]  Physical Therapy Treatment  Patient Details  Name: [name] [name] MRN: [id number] Date of Birth: Mar 04, 1960 Referring Provider: Rodell Perna, MD  Encounter Date: 10/10/2016      PT End of Session - 10/10/16 1315    Visit Number 8   Number of Visits 12   Date for PT Re-Evaluation 10/15/16   PT Start Time 1230   PT Stop Time 1312   PT Time Calculation (min) 42 min   Activity Tolerance Patient tolerated treatment well   Behavior During Therapy [facility] for tasks assessed/performed      Past Medical History:  Diagnosis Date  . Anginal pain ([place])    "EVERY SINGLE DAY" (05/04/2015)  . Arthritis    "fingers; ankles" (05/04/2015)  . COPD (chronic obstructive pulmonary disease) ([place])   . Daily headache    "hopefully this neck OR will help" (05/04/2015)  . Depression   . Dyspnea    on exertion  . Exertional shortness of breath   . GERD (gastroesophageal reflux disease)   . History of gout   . History of hiatal hernia   . History of shingles   . Hypertension    has never been on meds  . Neck pain    buldging disc  . Nocturia   . PONV (postoperative nausea and vomiting)    "just one time"  . Presence of permanent cardiac pacemaker   . Syncopal episodes   . Tachy-brady syndrome ([place])   . Vasovagal syncope    last one Jan 2017  . Weakness    numbness in left hand    Past Surgical History:  Procedure Laterality Date  . ANTERIOR CERVICAL DECOMP/DISCECTOMY FUSION  05/04/2015  . ANTERIOR CERVICAL DECOMP/DISCECTOMY FUSION N/A 05/04/2015   Procedure: C6-7 Anterior Cervical Discectomy and Fusion, Allograft, Plate ;  Surgeon: Marybelle Killings, MD;  Location: [facility];  Service: Orthopedics;  Laterality: N/A;  . BACK SURGERY    . CARDIAC CATHETERIZATION     2014  . CARDIAC ELECTROPHYSIOLOGY MAPPING AND ABLATION    . CARDIAC ELECTROPHYSIOLOGY STUDY & DFT     . HERNIA REPAIR    . INSERT / REPLACE / REMOVE PACEMAKER     [facility].Dr.Tony Rosita Fire is cardiologist  . LOOP RECORDER INSERTION  07/1999   Archie Endo 07/05/2010  . LOOP RECORDER REMOVAL  07/2001   Archie Endo 07/05/2010  . POSTERIOR CERVICAL FUSION/FORAMINOTOMY N/A 04/20/2016   Procedure: c6-7 posterior fusion, ILIAC BONE MARROW ASPIRATE, WIRING;  Surgeon: Marybelle Killings, MD;  Location: [facility];  Service: Orthopedics;  Laterality: N/A;  . SHOULDER ARTHROSCOPY W/ ROTATOR CUFF REPAIR Right   . UMBILICAL HERNIA REPAIR      There were no vitals filed for this visit.      Subjective Assessment - 10/10/16 1314    Subjective Patient reported feeling about the same and would like to DC today   Pertinent History PACEMAKER; cervical fusion march 2018   Currently in Pain? Yes   Pain Score 4    Pain Location Shoulder   Pain Orientation Left   Pain Descriptors / Indicators Sore   Pain Type Acute pain   Pain Frequency Constant   Aggravating Factors  prolong activity   Pain Relieving Factors rest            [facility] PT Assessment - 10/10/16 0001      ROM / Strength  AROM / PROM / Strength AROM;Strength     AROM   AROM Assessment Site Shoulder   Right/Left Shoulder Left   Left Shoulder Flexion 150 Degrees     Strength   Strength Assessment Site Shoulder   Right/Left Shoulder Left   Left Shoulder Flexion 4+/5   Left Shoulder Extension 5/5   Left Shoulder ABduction 4/5   Left Shoulder Internal Rotation 4/5   Left Shoulder External Rotation 4/5                     [facility] Adult PT Treatment/Exercise - 10/10/16 0001      Vasopneumatic   Number Minutes Vasopneumatic  15 minutes   Vasopnuematic Location  Shoulder   Vasopneumatic Pressure Low     Manual Therapy   Manual Therapy Soft tissue mobilization   Soft tissue mobilization STW to L posterior cuff, deltoids, UT, scalenes to reduce tone and pain                     PT Long Term Goals - 10/10/16 1316      PT  LONG TERM GOAL #1   Title I with HEP   Time 6   Period Weeks   Status Achieved     PT LONG TERM GOAL #2   Title Patient able to demonstrate L shoulder flexion to 155 deg or better with good mechanics to normalize ADLS.   Time 6   Period Weeks   Status Not Met  AROM 150 degrees 10/10/16     PT LONG TERM GOAL #3   Title Patient to demo 4+/5 L shoulder strength or better to normalize mechanics.   Time 6   Period Weeks   Status Not Met     PT LONG TERM GOAL #4   Title Patient able to perform ADLs with L shoulder with 2/10 pain or less.   Time 6   Period Weeks   Status Not Met     PT LONG TERM GOAL #5   Title Patient able to turn head to left without pain.   Time 6   Period Weeks   Status Not Met               Plan - 10/10/16 1346    Clinical Impression Statement Patient requested to DC today to HEP, Patient given yellow and red t-band for RW4 exercise progression. Patient met HEP goal yet no other goals met. Patient FOTO 42% limitation (initail 48%)   Rehab Potential Excellent   PT Frequency 2x / week   PT Duration 6 weeks   PT Treatment/Interventions ADLs/Self Care Home Management;Cryotherapy;Moist Heat;Ultrasound;Therapeutic exercise;Neuromuscular re-education;Passive range of motion;Manual techniques;Patient/family education;Taping;Vasopneumatic Device   PT Next Visit Plan DC   Consulted and Agree with Plan of Care Patient      Patient will benefit from skilled therapeutic intervention in order to improve the following deficits and impairments:  Decreased strength, Pain, Impaired UE functional use, Decreased range of motion, Postural dysfunction  Visit Diagnosis: Stiffness of left shoulder, not elsewhere classified  Muscle weakness (generalized)  Cervicalgia  Acute pain of left shoulder     Problem List Patient Active Problem List   Diagnosis Date Noted  . Cervical pseudoarthrosis ([place]) 04/20/2016  . Spondylosis, cervical 05/05/2015  . S/P cervical  spinal fusion 05/04/2015  . NONSPECIFIC ABNORMAL UNSPEC CV FUNCTION STUDY 04/06/2009  . CHEST PAIN UNSPECIFIED 01/20/2009  . Syncope and collapse 01/12/2009   Ladean Raya, PTA 10/10/16 1:52  PM  [facility] [place], [place], [address] Phone: [phone]   Fax:  [phone]  Name: [name] [name] MRN: [id number] Date of Birth: Feb 21, 1960  PHYSICAL THERAPY DISCHARGE SUMMARY  Visits from Start of Care: 8.  Current functional level related to goals / functional outcomes: See above.   Remaining deficits: Continued left shoulder pain.   Education / Equipment: HEP.  Plan: Patient agrees to discharge.  Patient goals were partially met. Patient is being discharged due to the patient's request.  ?????         Mali Applegate MPT

## 2019-02-16 ENCOUNTER — Ambulatory Visit: Payer: Medicare HMO | Attending: Internal Medicine

## 2019-02-16 ENCOUNTER — Other Ambulatory Visit: Payer: Self-pay

## 2019-02-16 DIAGNOSIS — Z20822 Contact with and (suspected) exposure to covid-19: Secondary | ICD-10-CM

## 2019-02-18 ENCOUNTER — Telehealth: Payer: Self-pay | Admitting: *Deleted

## 2019-02-18 LAB — NOVEL CORONAVIRUS, NAA

## 2019-02-18 NOTE — Telephone Encounter (Signed)
Patient's wife called for results, still pending ,wife is on [place].

## 2019-02-19 ENCOUNTER — Other Ambulatory Visit: Payer: Self-pay

## 2019-02-19 ENCOUNTER — Ambulatory Visit: Payer: Medicare HMO | Attending: Internal Medicine

## 2019-02-19 DIAGNOSIS — Z20822 Contact with and (suspected) exposure to covid-19: Secondary | ICD-10-CM

## 2019-02-21 LAB — NOVEL CORONAVIRUS, NAA: SARS-CoV-2, NAA: NOT DETECTED

## 2019-02-23 ENCOUNTER — Telehealth: Payer: Self-pay

## 2019-02-23 NOTE — Telephone Encounter (Signed)
Caller given negative result and verbalized understanding  

## 2019-11-12 ENCOUNTER — Other Ambulatory Visit: Payer: Self-pay

## 2019-11-12 ENCOUNTER — Emergency Department (HOSPITAL_COMMUNITY): Payer: Medicare HMO

## 2019-11-12 ENCOUNTER — Encounter (HOSPITAL_COMMUNITY): Payer: Self-pay | Admitting: *Deleted

## 2019-11-12 ENCOUNTER — Emergency Department (HOSPITAL_COMMUNITY)
Admission: EM | Admit: 2019-11-12 | Discharge: 2019-11-12 | Disposition: A | Discharge status: Home / Self Care | Payer: Medicare HMO | Attending: Emergency Medicine | Admitting: Emergency Medicine

## 2019-11-12 DIAGNOSIS — J449 Chronic obstructive pulmonary disease, unspecified: Secondary | ICD-10-CM | POA: Diagnosis not present

## 2019-11-12 DIAGNOSIS — J441 Chronic obstructive pulmonary disease with (acute) exacerbation: Secondary | ICD-10-CM

## 2019-11-12 DIAGNOSIS — I10 Essential (primary) hypertension: Secondary | ICD-10-CM | POA: Diagnosis not present

## 2019-11-12 DIAGNOSIS — R062 Wheezing: Secondary | ICD-10-CM | POA: Diagnosis not present

## 2019-11-12 DIAGNOSIS — Z955 Presence of coronary angioplasty implant and graft: Secondary | ICD-10-CM | POA: Insufficient documentation

## 2019-11-12 DIAGNOSIS — Z950 Presence of cardiac pacemaker: Secondary | ICD-10-CM | POA: Insufficient documentation

## 2019-11-12 DIAGNOSIS — Z20822 Contact with and (suspected) exposure to covid-19: Secondary | ICD-10-CM | POA: Diagnosis not present

## 2019-11-12 DIAGNOSIS — Z87891 Personal history of nicotine dependence: Secondary | ICD-10-CM | POA: Diagnosis not present

## 2019-11-12 DIAGNOSIS — R05 Cough: Secondary | ICD-10-CM | POA: Diagnosis present

## 2019-11-12 LAB — BASIC METABOLIC PANEL
Anion gap: 10 (ref 5–15)
BUN: 14 mg/dL (ref 6–20)
CO2: 23 mmol/L (ref 22–32)
Calcium: 8.8 mg/dL — ABNORMAL LOW (ref 8.9–10.3)
Chloride: 105 mmol/L (ref 98–111)
Creatinine, Ser: 1.03 mg/dL (ref 0.61–1.24)
GFR calc Af Amer: >60 mL/min (ref >60)
GFR calc non Af Amer: >60 mL/min (ref >60)
Glucose, Bld: 95 mg/dL (ref 70–99)
Potassium: 3.5 mmol/L (ref 3.5–5.1)
Sodium: 138 mmol/L (ref 135–145)

## 2019-11-12 LAB — CBC WITH DIFFERENTIAL/PLATELET
Abs Immature Granulocytes: 0.03 10*3/uL (ref 0.00–0.07)
Basophils Absolute: 0 10*3/uL (ref 0.0–0.1)
Basophils Relative: 0 %
Eosinophils Absolute: 0.3 10*3/uL (ref 0.0–0.5)
Eosinophils Relative: 3 %
HCT: 43.4 % (ref 39.0–52.0)
Hemoglobin: 14.6 g/dL (ref 13.0–17.0)
Immature Granulocytes: 0 %
Lymphocytes Relative: 21 %
Lymphs Abs: 2.3 10*3/uL (ref 0.7–4.0)
MCH: 32.2 pg (ref 26.0–34.0)
MCHC: 33.6 g/dL (ref 30.0–36.0)
MCV: 95.6 fL (ref 80.0–100.0)
Monocytes Absolute: 1.1 10*3/uL — ABNORMAL HIGH (ref 0.1–1.0)
Monocytes Relative: 11 %
Neutro Abs: 6.8 10*3/uL (ref 1.7–7.7)
Neutrophils Relative %: 65 %
Platelets: 248 10*3/uL (ref 150–400)
RBC: 4.54 MIL/uL (ref 4.22–5.81)
RDW: 12.6 % (ref 11.5–15.5)
WBC: 10.5 10*3/uL (ref 4.0–10.5)
nRBC: 0 % (ref 0.0–0.2)

## 2019-11-12 LAB — RESP PANEL BY RT PCR (RSV, FLU A&B, COVID)
Influenza A by PCR: NEGATIVE
Influenza B by PCR: NEGATIVE
Respiratory Syncytial Virus by PCR: NEGATIVE
SARS Coronavirus 2 by RT PCR: NEGATIVE

## 2019-11-12 MED ORDER — PREDNISONE 10 MG PO TABS: 20.0000 mg | ORAL_TABLET | Freq: Every day | ORAL | 0 refills | Status: DC

## 2019-11-12 MED ORDER — ALBUTEROL SULFATE HFA 108 (90 BASE) MCG/ACT IN AERS
4.0000 | INHALATION_SPRAY | RESPIRATORY_TRACT | Status: DC | PRN
  Administered 2019-11-12: 4 via RESPIRATORY_TRACT
  Filled 2019-11-12: qty 6.7

## 2019-11-12 MED ORDER — ALBUTEROL SULFATE HFA 108 (90 BASE) MCG/ACT IN AERS: 2.0000 | INHALATION_SPRAY | RESPIRATORY_TRACT | 0 refills | Status: DC | PRN

## 2019-11-12 MED ORDER — SODIUM CHLORIDE 0.9 % IV SOLN
500.0000 mg | Freq: Once | INTRAVENOUS | Status: AC
  Administered 2019-11-12: 500 mg via INTRAVENOUS
  Filled 2019-11-12: qty 500

## 2019-11-12 MED ORDER — METHYLPREDNISOLONE SODIUM SUCC 125 MG IJ SOLR
125.0000 mg | Freq: Once | INTRAMUSCULAR | Status: AC
  Administered 2019-11-12: 125 mg via INTRAVENOUS
  Filled 2019-11-12: qty 2

## 2019-11-12 MED ORDER — ALBUTEROL SULFATE (2.5 MG/3ML) 0.083% IN NEBU
2.5000 mg | INHALATION_SOLUTION | Freq: Once | RESPIRATORY_TRACT | Status: AC
  Administered 2019-11-12: 2.5 mg via RESPIRATORY_TRACT
  Filled 2019-11-12: qty 3

## 2019-11-12 MED ORDER — IOHEXOL 300 MG/ML  SOLN
75.0000 mL | Freq: Once | INTRAMUSCULAR | Status: AC | PRN
  Administered 2019-11-12: 75 mL via INTRAVENOUS

## 2019-11-12 MED ORDER — SODIUM CHLORIDE 0.9 % IV SOLN
1.0000 g | Freq: Once | INTRAVENOUS | Status: AC
  Administered 2019-11-12: 1 g via INTRAVENOUS
  Filled 2019-11-12: qty 10

## 2019-11-12 MED ORDER — DOXYCYCLINE HYCLATE 100 MG PO CAPS: 100.0000 mg | ORAL_CAPSULE | Freq: Two times a day (BID) | ORAL | 0 refills | Status: DC

## 2019-11-12 MED ORDER — IPRATROPIUM-ALBUTEROL 0.5-2.5 (3) MG/3ML IN SOLN
3.0000 mL | Freq: Once | RESPIRATORY_TRACT | Status: AC
Start: 2019-11-12 — End: 2019-11-12
  Administered 2019-11-12: 3 mL via RESPIRATORY_TRACT
  Filled 2019-11-12: qty 3

## 2019-11-12 MED ORDER — AEROCHAMBER Z-STAT PLUS/MEDIUM MISC
1.0000 | Freq: Once | Status: AC
  Administered 2019-11-12: 1

## 2019-11-12 NOTE — ED Provider Notes (Signed)
[facility] EMERGENCY DEPARTMENT Provider Note   CSN: [id number] Arrival date & time: 11/12/19  1337     History Chief Complaint  Patient presents with  . Shortness of Breath    [name] [name] is a 59 y.o. 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  He was outdoors washing cars 6 days ago and shortly after coming indoors, developed nasal congestion which quickly settled in his lungs, now with cough, productive of dark thick sputum and 1 episode of blood clot. Endorses fevers and chills with increasing sob.  Has bilateral ribcage pain triggered by coughing. He had a rapid Covid 19 test this week and has a followup send out test by his pcp which should be resulted by Saturday.  He is fully vaccinated for Covid 19.  He has had no treatments prior to arrival, but "sweated out" his fever (subjective) which he woke with this am.    The history is provided by the patient.  Shortness of Breath Associated symptoms: cough, fever and wheezing   Associated symptoms: no abdominal pain, no chest pain, no headaches, no neck pain, no rash, no sore throat and no vomiting        Past Medical History:  Diagnosis Date  . Anginal pain (HCC)    "EVERY SINGLE DAY" (05/04/2015)  . Arthritis    "fingers; ankles" (05/04/2015)  . COPD (chronic obstructive pulmonary disease) (HCC)   . Daily headache    "hopefully this neck OR will help" (05/04/2015)  . Depression   . Dyspnea    on exertion  . Exertional shortness of breath   . GERD (gastroesophageal reflux disease)   . History of gout   . History of hiatal hernia   . History of shingles   . Hypertension    has never been on meds  . Neck pain    buldging disc  . Nocturia   . PONV (postoperative nausea and vomiting)    "just one time"  . Presence of permanent cardiac  pacemaker   . Syncopal episodes   . Tachy-brady syndrome (HCC)   . Vasovagal syncope    last one Jan 2017  . Weakness    numbness in left hand    Patient Active Problem List   Diagnosis Date Noted  . Cervical pseudoarthrosis (HCC) 04/20/2016  . Spondylosis, cervical 05/05/2015  . S/P cervical spinal fusion 05/04/2015  . NONSPECIFIC ABNORMAL UNSPEC CV FUNCTION STUDY 04/06/2009  . CHEST PAIN UNSPECIFIED 01/20/2009  . Syncope and collapse 01/12/2009    Past Surgical History:  Procedure Laterality Date  . ANTERIOR CERVICAL DECOMP/DISCECTOMY FUSION  05/04/2015  . ANTERIOR CERVICAL DECOMP/DISCECTOMY FUSION N/A 05/04/2015   Procedure: C6-7 Anterior Cervical Discectomy and Fusion, Allograft, Plate ;  Surgeon: Eldred Manges, MD;  Location: [facility] OR;  Service: Orthopedics;  Laterality: N/A;  . BACK SURGERY    . CARDIAC CATHETERIZATION     2014  . CARDIAC ELECTROPHYSIOLOGY MAPPING AND ABLATION    . CARDIAC ELECTROPHYSIOLOGY STUDY & DFT    . HERNIA REPAIR    . INSERT / REPLACE / REMOVE PACEMAKER     [facility].Dr.Tony Sharol Harness is cardiologist  . LOOP RECORDER INSERTION  07/1999   [name] 07/05/2010  . LOOP RECORDER REMOVAL  07/2001   [name] 07/05/2010  . POSTERIOR CERVICAL FUSION/FORAMINOTOMY N/A 04/20/2016  Procedure: c6-7 posterior fusion, ILIAC BONE MARROW ASPIRATE, WIRING;  Surgeon: Eldred MangesMark C Yates, MD;  Location: [facility] OR;  Service: Orthopedics;  Laterality: N/A;  . SHOULDER ARTHROSCOPY W/ ROTATOR CUFF REPAIR Right   . UMBILICAL HERNIA REPAIR         Family History  Problem Relation Age of Onset  . Hypertension Neg Hx   . Diabetes Neg Hx   . Coronary artery disease Neg Hx     Social History   Tobacco Use  . Smoking status: Former Smoker    Packs/day: 0.12    Years: 2.00    Pack years: 0.24    Types: Cigarettes  . Smokeless tobacco: Never Used  . Tobacco comment: quit smoking in 1981  Substance Use Topics  . Alcohol use: Yes    Comment: occasional  . Drug use: Yes    Frequency: 3.0  times per week    Types: Marijuana    Comment: 2-3 times per week     Home Medications Prior to Admission medications   Medication Sig Start Date End Date Taking? Authorizing Provider  escitalopram (LEXAPRO) 10 MG tablet Take 10 mg by mouth daily. 03/26/16   [provider]  fludrocortisone (FLORINEF) 0.1 MG tablet Take 1 tablet (0.1 mg total) by mouth 2 (two) times daily. 10/06/10   Marinus Mawaylor, Gregg W, MD  HYDROcodone-acetaminophen [facility](NORCO) 10-325 MG tablet Take one tablet by mouth every 4-6 hours as needed for pain 04/25/16   Cammy Copaean, Gregory Scott, MD  methocarbamol (ROBAXIN) 500 MG tablet Take 1 tablet (500 mg total) by mouth 4 (four) times daily. 04/20/16   Naida Sleightwens, James M, PA-C  oxazepam (SERAX) 30 MG capsule TAKE ONE CAPSULE BY MOUTH AT BEDTIME 03/27/16   [provider]  oxyCODONE-acetaminophen (PERCOCET) 7.5-325 MG tablet Take 1 tablet by mouth every 6 (six) hours as needed for severe pain. 04/20/16   Naida Sleightwens, James M, PA-C  promethazine (PHENERGAN) 50 MG tablet Take 0.5 tablets (25 mg total) by mouth every 6 (six) hours as needed for nausea or vomiting. 04/21/16   Eldred MangesYates, Mark C, MD  propranolol (INDERAL) 10 MG tablet Take 10 mg by mouth 3 (three) times daily.  03/22/16   [provider]  ranitidine (ZANTAC) 300 MG tablet TAKE 1 TABLET BY MOUTH AT BEDTIME DAILY 01/18/16   [provider]    Allergies    No known allergies  Review of Systems   Review of Systems  Constitutional: Positive for chills, fatigue and fever.  HENT: Negative for congestion and sore throat.   Eyes: Negative.   Respiratory: Positive for cough, chest tightness, shortness of breath and wheezing.   Cardiovascular: Negative for chest pain and leg swelling.  Gastrointestinal: Negative for abdominal pain, nausea and vomiting.  Genitourinary: Negative.   Musculoskeletal: Positive for myalgias. Negative for arthralgias, joint swelling and neck pain.  Skin: Negative.  Negative for rash and wound.   Neurological: Negative for dizziness, weakness, light-headedness, numbness and headaches.  Psychiatric/Behavioral: Negative.   All other systems reviewed and are negative.   Physical Exam Updated Vital Signs BP 126/75   Pulse 83   Temp 99 F (37.2 C)   Resp (!) 24   Ht 5\' 9"  (1.753 m)   Wt 97.5 kg   SpO2 98%   BMI 31.74 kg/m   Physical Exam Vitals and nursing note reviewed.  Constitutional:      Appearance: He is well-developed.  HENT:     Head: Normocephalic and atraumatic.  Eyes:  Conjunctiva/sclera: Conjunctivae normal.  Cardiovascular:     Rate and Rhythm: Normal rate and regular rhythm.     Heart sounds: Normal heart sounds.  Pulmonary:     Effort: Accessory muscle usage and respiratory distress present.     Breath sounds: Decreased breath sounds, wheezing and rhonchi present.     Comments: Decreased breath sounds throughout with expiratory wheeze throughout, prolonged expirations,  Few crackles L>R base.  Chest:     Chest wall: Tenderness present.       Comments: ttp bilateral lower ribs lateral to axillary line.  No point tenderness.   Abdominal:     General: Bowel sounds are normal.     Palpations: Abdomen is soft.     Tenderness: There is no abdominal tenderness.  Musculoskeletal:        General: Normal range of motion.     Cervical back: Normal range of motion.  Skin:    General: Skin is warm and dry.  Neurological:     Mental Status: He is alert.     ED Results / Procedures / Treatments   Labs (all labs ordered are listed, but only abnormal results are displayed) Labs Reviewed  CBC WITH DIFFERENTIAL/PLATELET - Abnormal; Notable for the following components:      Result Value   Monocytes Absolute 1.1 (*)    All other components within normal limits  BASIC METABOLIC PANEL - Abnormal; Notable for the following components:   Calcium 8.8 (*)    All other components within normal limits  RESP PANEL BY RT PCR (RSV, FLU A&B, COVID)     EKG None  Radiology DG Chest Portable 1 View  Addendum Date: 11/12/2019   ADDENDUM REPORT: 11/12/2019 18:11 ADDENDUM: These results were called by telephone at the time of interpretation on 11/12/2019 at 5:11pm to provider Dylyn Mclaren , who verbally acknowledged these results. Follow-up by Dr. Lincoln Maxin: Metallic densities of unclear etiologies represented patient's sunglasses overlying his chest. CT chest will still be ordered and performed to further evaluation the left lingula/lower lung zone. Electronically Signed   By: Tish Frederickson M.D.   On: 11/12/2019 18:11   Result Date: 11/12/2019 CLINICAL DATA:  Shortness of breath, chest congestion concern for pneumonia. EXAM: PORTABLE CHEST 1 VIEW COMPARISON:  CT abdomen pelvis 04/18/2012, chest x-ray 08/05/2009. FINDINGS: Left chest wall dual lead cardiac pacemaker with 1 lead overlying the expected region of the right atrium and the other lead overlying the expected region of the right ventricle. Linear 5.3 cm metallic density with interlocking screw overlying the left hilar region of unclear etiology. Similarly screw overlying the upper thoracic vertebral bodies of unclear etiology. Screw overlying the upper thoracic vertebral body subjacent to a flat washer. Plate and screw fixation with cerclage wire at the cervical level. The heart size and mediastinal contours are within normal limits. Left lingular nodular-like airspace opacity. No pulmonary edema. No pleural effusion. No pneumothorax. No acute osseous abnormality. IMPRESSION: 1. Interval development of a left lingular nodular-like airspace opacity that could represent infection/inflammation versus atelectasis. An underlying nodule cannot be excluded, consider CT chest for further evaluation. If not CT chest obtained, follow-up PA and lateral chest X-ray is recommended in 3-4 weeks following trial of antibiotic therapy to ensure resolution and exclude underlying malignancy. 2. Metallic densities  overlying the left hilar region and upper thoracic vertebral bodies as described above of unclear etiology. Consider lateral view or a chest CT if the etiology of these densities is not obvious when interviewing patient.  Electronically Signed: By: Tish Frederickson M.D. On: 11/12/2019 17:08    Procedures Procedures (including critical care time)  Medications Ordered in ED Medications  albuterol (VENTOLIN HFA) 108 (90 Base) MCG/ACT inhaler 4 puff (4 puffs Inhalation Given 11/12/19 1536)  aerochamber Z-Stat Plus/medium 1 each (has no administration in time range)    ED Course  I have reviewed the triage vital signs and the nursing notes.  Pertinent labs & imaging results that were available during my care of the patient were reviewed by me and considered in my medical decision making (see chart for details).    MDM Rules/Calculators/A&P                          Pt with initial respiratory distress with accessory muscle use, poor aeration and wheezing,  Although no hypoxia, has mild tachypnea.  Xrays suggesting CAP vs atelectasis with nodular density.  History suggests infectious process.  He was given abx for CAP, rocephin given,  zithromax pending.  Per radiology, recommends CT imaging to further assess suspected nodule at the lingula.  He is Covid - , added albuterol/atrovent neb tx given he does not have Covid.  Reports chronic work related respiratory exposure to silica.   Dr. Estell Harpin assumes pt care.    Final Clinical Impression(s) / ED Diagnoses Final diagnoses:  None    Rx / DC Orders ED Discharge Orders    None       Victoriano Lain 11/12/19 1854    Bethann Berkshire, MD 11/15/19 [phone]

## 2019-11-12 NOTE — Discharge Instructions (Addendum)
Follow-up with your doctor next week.  Stop smoking anything.  Return if any problems

## 2019-11-12 NOTE — ED Triage Notes (Signed)
C/o nasal and chest congestion, shortness of breath onset 4 days ago, concerned he may have pneumonia, states he has had 2 negative covid tests this week

## 2019-12-02 ENCOUNTER — Encounter: Payer: Self-pay | Admitting: *Deleted

## 2019-12-03 ENCOUNTER — Ambulatory Visit: Payer: Medicare HMO | Admitting: Cardiology

## 2019-12-03 ENCOUNTER — Other Ambulatory Visit: Payer: Self-pay

## 2019-12-03 ENCOUNTER — Encounter: Payer: Self-pay | Admitting: Cardiology

## 2019-12-03 VITALS — BP 112/62 | HR 75 | Ht 69.0 in | Wt 221.0 lb

## 2019-12-03 DIAGNOSIS — R55 Syncope and collapse: Secondary | ICD-10-CM

## 2019-12-03 DIAGNOSIS — Z950 Presence of cardiac pacemaker: Secondary | ICD-10-CM

## 2019-12-03 NOTE — Patient Instructions (Signed)
Your physician recommends that you schedule a follow-up appointment in: 2 MONTHS WITH DR [facility]  Your physician recommends that you continue on your current medications as directed. Please refer to the Current Medication list given to you today.  WE WILL SCHEDULE YOU FOR DEVICE CLINIC   Thank you for choosing [facility]!!

## 2019-12-03 NOTE — Progress Notes (Signed)
Clinical Summary [name] [name] is a 59 y.o.male  Previously followed at [facility]   1. Syncope - ongoing several years, roughly 10 years - pacemaker placed in 2013, had some improvement  - episodes about 2-3 times per day. Can occur at any time, any positon or activity.  - can feel episode coming on. Feels clammy, lightheaded, sweaty, can have some nausea. Often feels presyncopal. Lasts few seconds to minutes, but some times can last several hours. Can have falls but no true syncope.   - drinks fluid regularly   - from [facility] notes history of malignant neurocardiogenic   2. Chest pain - 08/2018 nuclear stress: no ischemia    3. Pacemaker - placd 2013 at [facility] - dual chamber Biotronik   4. PSVT - [facility] notes mention prior ablation in 2013  5. PVCs   Past Medical History:  Diagnosis Date   Anginal pain (HCC)    "EVERY SINGLE DAY" (05/04/2015)   Arthritis    "fingers; ankles" (05/04/2015)   COPD (chronic obstructive pulmonary disease) (HCC)    Daily headache    "hopefully this neck OR will help" (05/04/2015)   Depression    Dyspnea    on exertion   Exertional shortness of breath    GERD (gastroesophageal reflux disease)    History of gout    History of hiatal hernia    History of shingles    Hypertension    has never been on meds   Neck pain    buldging disc   Nocturia    PONV (postoperative nausea and vomiting)    "just one time"   Presence of permanent cardiac pacemaker    Syncopal episodes    Tachy-brady syndrome (HCC)    Vasovagal syncope    last one Jan 2017   Weakness    numbness in left hand     Allergies  Allergen Reactions   No Known Allergies      Current Outpatient Medications  Medication Sig Dispense Refill   albuterol (VENTOLIN HFA) 108 (90 Base) MCG/ACT inhaler Inhale 2 puffs into the lungs every 2 (two) hours as needed for wheezing or shortness of breath (cough). 1 each 0   aspirin EC 81 MG tablet Take 81 mg by mouth  daily. Swallow whole.     cyclobenzaprine (FLEXERIL) 10 MG tablet Take 10 mg by mouth 3 (three) times daily as needed for muscle spasms.     doxycycline (VIBRAMYCIN) 100 MG capsule Take 1 capsule (100 mg total) by mouth 2 (two) times daily. One po bid x 7 days 14 capsule 0   escitalopram (LEXAPRO) 10 MG tablet Take 10 mg by mouth daily.  11   famotidine (PEPCID) 40 MG tablet Take 40 mg by mouth daily.     fludrocortisone (FLORINEF) 0.1 MG tablet Take 1 tablet (0.1 mg total) by mouth 2 (two) times daily. 60 tablet 11   Fluticasone-Umeclidin-Vilant (TRELEGY ELLIPTA IN) Inhale into the lungs.     HYDROcodone-acetaminophen (NORCO) 10-325 MG tablet Take one tablet by mouth every 4-6 hours as needed for pain 60 tablet 0   methocarbamol (ROBAXIN) 500 MG tablet Take 1 tablet (500 mg total) by mouth 4 (four) times daily. 50 tablet 0   nitroGLYCERIN (NITROSTAT) 0.4 MG SL tablet Place 0.4 mg under the tongue every 5 (five) minutes as needed for chest pain.     oxazepam (SERAX) 30 MG capsule TAKE ONE CAPSULE BY MOUTH AT BEDTIME  3   oxyCODONE-acetaminophen (  PERCOCET) 7.5-325 MG tablet Take 1 tablet by mouth every 6 (six) hours as needed for severe pain. 50 tablet 0   predniSONE (DELTASONE) 10 MG tablet Take 2 tablets (20 mg total) by mouth daily. 14 tablet 0   promethazine (PHENERGAN) 50 MG tablet Take 0.5 tablets (25 mg total) by mouth every 6 (six) hours as needed for nausea or vomiting. 20 tablet 0   propranolol (INDERAL) 10 MG tablet Take 10 mg by mouth 3 (three) times daily.      ranitidine (ZANTAC) 300 MG tablet TAKE 1 TABLET BY MOUTH AT BEDTIME DAILY  6   No current facility-administered medications for this visit.     Past Surgical History:  Procedure Laterality Date   ANTERIOR CERVICAL DECOMP/DISCECTOMY FUSION  05/04/2015   ANTERIOR CERVICAL DECOMP/DISCECTOMY FUSION N/A 05/04/2015   Procedure: C6-7 Anterior Cervical Discectomy and Fusion, Allograft, Plate ;  Surgeon: Eldred Manges, MD;  Location:  [facility] OR;  Service: Orthopedics;  Laterality: N/A;   BACK SURGERY     CARDIAC CATHETERIZATION     2014   CARDIAC ELECTROPHYSIOLOGY MAPPING AND ABLATION     CARDIAC ELECTROPHYSIOLOGY STUDY & DFT     HERNIA REPAIR     INSERT / REPLACE / REMOVE PACEMAKER     [facility].Dr.Tony Sharol Harness is cardiologist   LOOP RECORDER INSERTION  07/1999   [name] 07/05/2010   LOOP RECORDER REMOVAL  07/2001   [name] 07/05/2010   POSTERIOR CERVICAL FUSION/FORAMINOTOMY N/A 04/20/2016   Procedure: c6-7 posterior fusion, ILIAC BONE MARROW ASPIRATE, WIRING;  Surgeon: Eldred Manges, MD;  Location: [facility] OR;  Service: Orthopedics;  Laterality: N/A;   SHOULDER ARTHROSCOPY W/ ROTATOR CUFF REPAIR Right    UMBILICAL HERNIA REPAIR       Allergies  Allergen Reactions   No Known Allergies       Family History  Problem Relation Age of Onset   Diabetes Mother    Hypertension Mother    Hypertension Father    COPD Father    COPD Brother    Coronary artery disease Neg Hx      Social History [name] [name] reports that he has quit smoking. His smoking use included cigarettes. He has a 0.24 pack-year smoking history. He has never used smokeless tobacco. [name] [name] reports current alcohol use.   Review of Systems CONSTITUTIONAL: No weight loss, fever, chills, weakness or fatigue.  HEENT: Eyes: No visual loss, blurred vision, double vision or yellow sclerae.No hearing loss, sneezing, congestion, runny nose or sore throat.  SKIN: No rash or itching.  CARDIOVASCULAR: per hpi RESPIRATORY: No shortness of breath, cough or sputum.  GASTROINTESTINAL: No anorexia, nausea, vomiting or diarrhea. No abdominal pain or blood.  GENITOURINARY: No burning on urination, no polyuria NEUROLOGICAL: No headache, dizziness, syncope, paralysis, ataxia, numbness or tingling in the extremities. No change in bowel or bladder control.  MUSCULOSKELETAL: No muscle, back pain, joint pain or stiffness.  LYMPHATICS: No enlarged nodes. No history of splenectomy.   PSYCHIATRIC: No history of depression or anxiety.  ENDOCRINOLOGIC: No reports of sweating, cold or heat intolerance. No polyuria or polydipsia.  Marland Kitchen   Physical Examination There were no vitals filed for this visit. There were no vitals filed for this visit.  Gen: resting comfortably, no acute distress HEENT: no scleral icterus, pupils equal round and reactive, no palptable cervical adenopathy,  CV Resp: Clear to auscultation bilaterally GI: abdomen is soft, non-tender, non-distended, normal bowel sounds, no hepatosplenomegaly MSK: extremities are warm, no edema.  Skin:  warm, no rash Neuro:  no focal deficits Psych: appropriate affect   Diagnostic Studies 08/2018 nuclear study Gated stress images:  Ejection fraction: >70%.  Wall motion abnormalities: None.   CONCLUSION:   1.  No inducible ischemia.  2.  Normal left ventricular function (EF >70%).    05/2012 cath   OTHER:  Widely patent coronaries.  Normal left main.  Normal left ventricular function.     08/2011 [facility] EP ablation Summary:   1. NSR at baseline.  2. Spontaneous PVC of LBBB morphology, inferior and predominately rightward  (isoelectric in I, negative in aVL) directed axis identified. Mapping in the  LVOT at the level of the AV was later than surface activation. RVOT  mapping confirmed PVC's originated from area 3 of the RVOT and was  pre-QRS. RF application here induced automaticity with a 12/12 match to the  clinical PVC. The clinical PVC was not seen after the first lesion despite  aggressive eipnephrine challenge.  3. No overt complications    Assessment and Plan  1.Neurocardiogenic syncope/pacemaker - long history of neurocardiogenic syncope previously evaluated at [facility] - recently has done fairly well clinically, will have him establish in our device clinic.   2. PSVT No recent significant symptoms, continue to monitor.     Antoine Poche, M.D.

## 2019-12-28 ENCOUNTER — Encounter: Payer: Medicare HMO | Admitting: Internal Medicine

## 2020-01-25 ENCOUNTER — Other Ambulatory Visit: Payer: Self-pay

## 2020-01-25 ENCOUNTER — Encounter: Payer: Self-pay | Admitting: Internal Medicine

## 2020-01-25 ENCOUNTER — Ambulatory Visit (INDEPENDENT_AMBULATORY_CARE_PROVIDER_SITE_OTHER): Payer: Medicare HMO | Admitting: Internal Medicine

## 2020-01-25 VITALS — BP 128/76 | HR 76 | Wt 225.0 lb

## 2020-01-25 DIAGNOSIS — I10 Essential (primary) hypertension: Secondary | ICD-10-CM

## 2020-01-25 DIAGNOSIS — I495 Sick sinus syndrome: Secondary | ICD-10-CM

## 2020-01-25 DIAGNOSIS — R55 Syncope and collapse: Secondary | ICD-10-CM | POA: Diagnosis not present

## 2020-01-25 NOTE — Patient Instructions (Addendum)
Medication Instructions:  Your physician recommends that you continue on your current medications as directed. Please refer to the Current Medication list given to you today.  *If you need a refill on your cardiac medications before your next appointment, please call your pharmacy*  Lab Work: None ordered.  If you have labs (blood work) drawn today and your tests are completely normal, you will receive your results only by: . MyChart Message (if you have MyChart) OR . A paper copy in the mail If you have any lab test that is abnormal or we need to change your treatment, we will call you to review the results.  Testing/Procedures: None ordered.  Follow-Up: At [facility], you and your health needs are our priority.  As part of our continuing mission to provide you with exceptional heart care, we have created designated Provider Care Teams.  These Care Teams include your primary Cardiologist (physician) and Advanced Practice Providers (APPs -  Physician Assistants and Nurse Practitioners) who all work together to provide you with the care you need, when you need it.  We recommend signing up for the patient portal called "MyChart".  Sign up information is provided on this After Visit Summary.  MyChart is used to connect with patients for Virtual Visits (Telemedicine).  Patients are able to view lab/test results, encounter notes, upcoming appointments, etc.  Non-urgent messages can be sent to your provider as well.   To learn more about what you can do with MyChart, go to [email or web address].    Your next appointment:   Your physician wants you to follow-up in: 1 year with Dr. Allred in [place]. You will receive a reminder letter in the mail two months in advance. If you don't receive a letter, please call our office to schedule the follow-up appointment.   Other Instructions:  

## 2020-01-25 NOTE — Progress Notes (Signed)
Jason Coop, FNP: Primary Cardiologist:  Dr Wyline Mood Previously Dr Bard Herbert [facility])  [name] [name] is a 59 y.o. male with a h/o bradycardia sp PPM (BTK) by Dr Bard Herbert at [facility] who presents today to establish care in the Electrophysiology device clinic.   The patient reports doing reasonably well since having a pacemaker implanted. He continues to have syncope secondary to vagal episodes.  These may have improved partially s/p PPM.  Today, he  denies symptoms of palpitations, chest pain, shortness of breath, orthopnea, PND, lower extremity edema, dizziness, presyncope, syncope, or neurologic sequela.  The patientis tolerating medications without difficulties and is otherwise without complaint today.   Past Medical History:  Diagnosis Date  . Anginal pain (HCC)    "EVERY SINGLE DAY" (05/04/2015)  . Arthritis    "fingers; ankles" (05/04/2015)  . COPD (chronic obstructive pulmonary disease) (HCC)   . Daily headache    "hopefully this neck OR will help" (05/04/2015)  . Depression   . GERD (gastroesophageal reflux disease)   . History of gout   . History of hiatal hernia   . History of shingles   . Hypertension    has never been on meds  . Neck pain    buldging disc  . Nocturia   . Presence of permanent cardiac pacemaker   . Sick sinus syndrome (HCC)   . Vasovagal syncope    last one Jan 2017  . Weakness    numbness in left hand   Past Surgical History:  Procedure Laterality Date  . ANTERIOR CERVICAL DECOMP/DISCECTOMY FUSION  05/04/2015  . ANTERIOR CERVICAL DECOMP/DISCECTOMY FUSION N/A 05/04/2015   Procedure: C6-7 Anterior Cervical Discectomy and Fusion, Allograft, Plate ;  Surgeon: Eldred Manges, MD;  Location: [facility] OR;  Service: Orthopedics;  Laterality: N/A;  . BACK SURGERY    . CARDIAC CATHETERIZATION     2014  . CARDIAC ELECTROPHYSIOLOGY MAPPING AND ABLATION    . CARDIAC ELECTROPHYSIOLOGY STUDY & DFT    . HERNIA REPAIR    . LOOP RECORDER INSERTION  07/1999    [name] 07/05/2010  . LOOP RECORDER REMOVAL  07/2001   [name] 07/05/2010  . PACEMAKER IMPLANT  03/01/2011   Biotronik Evia implanted by Dr Luella Cook at [facility] for sick sinus syndrome  . POSTERIOR CERVICAL FUSION/FORAMINOTOMY N/A 04/20/2016   Procedure: c6-7 posterior fusion, ILIAC BONE MARROW ASPIRATE, WIRING;  Surgeon: Eldred Manges, MD;  Location: [facility] OR;  Service: Orthopedics;  Laterality: N/A;  . SHOULDER ARTHROSCOPY W/ ROTATOR CUFF REPAIR Right   . UMBILICAL HERNIA REPAIR      Social History   Socioeconomic History  . Marital status: Married    Spouse name: Not on file  . Number of children: Not on file  . Years of education: Not on file  . Highest education level: Not on file  Occupational History  . Occupation: Full time  Tobacco Use  . Smoking status: Former Smoker    Packs/day: 0.12    Years: 2.00    Pack years: 0.24    Types: Cigarettes  . Smokeless tobacco: Never Used  . Tobacco comment: quit smoking in 1981  Substance and Sexual Activity  . Alcohol use: Yes    Comment: occasional  . Drug use: Yes    Frequency: 3.0 times per week    Types: Marijuana    Comment: 2-3 times per week   . Sexual activity: Yes  Other Topics Concern  . Not on file  Social  History Narrative   Married   Social Determinants of Corporate investment banker Strain:   . Difficulty of Paying Living Expenses: Not on file  Food Insecurity:   . Worried About Programme researcher, broadcasting/film/video in the Last Year: Not on file  . Ran Out of Food in the Last Year: Not on file  Transportation Needs:   . Lack of Transportation (Medical): Not on file  . Lack of Transportation (Non-Medical): Not on file  Physical Activity:   . Days of Exercise per Week: Not on file  . Minutes of Exercise per Session: Not on file  Stress:   . Feeling of Stress : Not on file  Social Connections:   . Frequency of Communication with Friends and Family: Not on file  . Frequency of Social Gatherings with Friends and Family: Not on  file  . Attends Religious Services: Not on file  . Active Member of Clubs or Organizations: Not on file  . Attends Banker Meetings: Not on file  . Marital Status: Not on file  Intimate Partner Violence:   . Fear of Current or Ex-Partner: Not on file  . Emotionally Abused: Not on file  . Physically Abused: Not on file  . Sexually Abused: Not on file    Family History  Problem Relation Age of Onset  . Diabetes Mother   . Hypertension Mother   . Hypertension Father   . COPD Father   . COPD Brother   . Coronary artery disease Neg Hx     Allergies  Allergen Reactions  . No Known Allergies     Current Outpatient Medications  Medication Sig Dispense Refill  . aspirin EC 81 MG tablet Take 81 mg by mouth daily. Swallow whole.    . nitroGLYCERIN (NITROSTAT) 0.4 MG SL tablet Place 0.4 mg under the tongue every 5 (five) minutes as needed for chest pain.     No current facility-administered medications for this visit.    ROS- all systems are reviewed and negative except as per HPI  Physical Exam: Vitals:   01/25/20 1241  BP: 128/76  Pulse: 76  SpO2: 98%  Weight: 225 lb (102.1 kg)    GEN- The patient is well appearing, alert and oriented x 3 today.   Head- normocephalic, atraumatic Eyes-  Sclera clear, conjunctiva pink Ears- hearing intact Oropharynx- clear Neck- supple,   Lungs- Clear to ausculation bilaterally, normal work of breathing Chest- pacemaker pocket is well healed Heart- Regular rate and rhythm, no murmurs, rubs or gallops, PMI not laterally displaced GI- soft, NT, ND, + BS Extremities- no clubbing, cyanosis, or edema MS- no significant deformity or atrophy Skin- no rash or lesion Psych- euthymic mood, full affect Neuro- strength and sensation are intact  Pacemaker interrogation- reviewed in detail today,  See PACEART report  Assessment and Plan:  1. Sick sinus syndrome Normal pacemaker function See Pace Art report Inappropriate mode  switches are noted.  This is due to far field sensing.  I have reduced sensitivity from 0.7 to 1.2 today he is not device dependant today  2. Vasovagal syncope Improved but not resolved with PPM Salt liberalization and adequate hydration have been advised previously  3. PVCs S/p ablation at [facility] No changes  4. HTN Stable No change required today  Return to see me in a year in [place] office I have stressed the importance of remote monitoring compliance in the interim.  Hillis Range MD, [facility] 01/25/2020 12:53 PM

## 2020-01-27 NOTE — Addendum Note (Signed)
Addended by: Solon Augusta on: 01/27/2020 07:56 AM   Modules accepted: Orders

## 2020-02-11 ENCOUNTER — Ambulatory Visit (INDEPENDENT_AMBULATORY_CARE_PROVIDER_SITE_OTHER): Payer: Medicare HMO | Admitting: Cardiology

## 2020-02-11 ENCOUNTER — Encounter: Payer: Self-pay | Admitting: Cardiology

## 2020-02-11 VITALS — BP 114/78 | HR 76 | Ht 69.0 in | Wt 219.2 lb

## 2020-02-11 DIAGNOSIS — R55 Syncope and collapse: Secondary | ICD-10-CM | POA: Diagnosis not present

## 2020-02-11 MED ORDER — FLUDROCORTISONE ACETATE 0.1 MG PO TABS: 0.1000 mg | ORAL_TABLET | Freq: Every day | ORAL | 1 refills | Status: DC

## 2020-02-11 MED ORDER — NITROGLYCERIN 0.4 MG SL SUBL: 0.4000 mg | SUBLINGUAL_TABLET | SUBLINGUAL | 2 refills | Status: DC | PRN

## 2020-02-11 NOTE — Patient Instructions (Signed)
Your physician recommends that you schedule a follow-up appointment in: 4 MONTHS WITH DR [facility]  Your physician has recommended you make the following change in your medication:   START FLORINEF 0.1 MG DAILY  WE HAVE GIVEN YOU RX FOR COMPRESSION STOCKINGS  Thank you for choosing [facility]!!

## 2020-02-11 NOTE — Progress Notes (Signed)
Clinical Summary [name] [name] is a 59 y.o.male seen today for follow up of the following medical problems.   Previously followed at [facility]   1. Neurocardiogenic Syncope - ongoing several years, roughly 10 years - previusly followed at [facility] - from there notes history of neurocardiogenic syncope, had pacemaker placed for this reason.  - some mild presyncopal episodes.Several time a week. Often with standing up - stays well hydrated.     2. Chronic chest pain - 08/2018 nuclear stress: no ischemia - notes mention chronic chest pain nearly 10 years. Multiple stress tests negative, prior cath with normal coroarnies.    3. Pacemaker - placd 2013 at [facility] - dual chamber Biotronik - now followed by Dr [name] [name], normal recent device check.    3. PSVT - [facility] notes mention prior ablation in 2013  4. PVCs    Has had covid vaccine x 2, moderna.  Past Medical History:  Diagnosis Date  . Anginal pain (HCC)    "EVERY SINGLE DAY" (05/04/2015)  . Arthritis    "fingers; ankles" (05/04/2015)  . COPD (chronic obstructive pulmonary disease) (HCC)   . Daily headache    "hopefully this neck OR will help" (05/04/2015)  . Depression   . GERD (gastroesophageal reflux disease)   . History of gout   . History of hiatal hernia   . History of shingles   . Hypertension    has never been on meds  . Neck pain    buldging disc  . Presence of permanent cardiac pacemaker   . Sick sinus syndrome (HCC)   . Vasovagal syncope    last one Jan 2017     Allergies  Allergen Reactions  . No Known Allergies      Current Outpatient Medications  Medication Sig Dispense Refill  . aspirin EC 81 MG tablet Take 81 mg by mouth daily. Swallow whole.    . nitroGLYCERIN (NITROSTAT) 0.4 MG SL tablet Place 0.4 mg under the tongue every 5 (five) minutes as needed for chest pain.     No current facility-administered medications for this visit.     Past Surgical History:   Procedure Laterality Date  . ANTERIOR CERVICAL DECOMP/DISCECTOMY FUSION  05/04/2015  . ANTERIOR CERVICAL DECOMP/DISCECTOMY FUSION N/A 05/04/2015   Procedure: C6-7 Anterior Cervical Discectomy and Fusion, Allograft, Plate ;  Surgeon: [name] Manges, MD;  Location: [facility] OR;  Service: Orthopedics;  Laterality: N/A;  . BACK SURGERY    . CARDIAC CATHETERIZATION     2014  . CARDIAC ELECTROPHYSIOLOGY MAPPING AND ABLATION    . CARDIAC ELECTROPHYSIOLOGY STUDY & DFT    . HERNIA REPAIR    . LOOP RECORDER INSERTION  07/1999   [name] [name] 07/05/2010  . LOOP RECORDER REMOVAL  07/2001   [name] [name] 07/05/2010  . PACEMAKER IMPLANT  03/01/2011   Biotronik Evia implanted by Dr [name] [name] at [facility] for sick sinus syndrome  . POSTERIOR CERVICAL FUSION/FORAMINOTOMY N/A 04/20/2016   Procedure: c6-7 posterior fusion, ILIAC BONE MARROW ASPIRATE, WIRING;  Surgeon: [name] Manges, MD;  Location: [facility] OR;  Service: Orthopedics;  Laterality: N/A;  . SHOULDER ARTHROSCOPY W/ ROTATOR CUFF REPAIR Right   . UMBILICAL HERNIA REPAIR       Allergies  Allergen Reactions  . No Known Allergies       Family History  Problem Relation Age of Onset  . Diabetes Mother   . Hypertension Mother   . Hypertension Father   . COPD Father   .  COPD Brother   . Coronary artery disease Neg Hx      Social History [name] [name] reports that he has quit smoking. His smoking use included cigarettes. He has a 0.24 pack-year smoking history. He has never used smokeless tobacco. [name] [name] reports current alcohol use.   Review of Systems CONSTITUTIONAL: No weight loss, fever, chills, weakness or fatigue.  HEENT: Eyes: No visual loss, blurred vision, double vision or yellow sclerae.No hearing loss, sneezing, congestion, runny nose or sore throat.  SKIN: No rash or itching.  CARDIOVASCULAR: per hpi RESPIRATORY: No shortness of breath, cough or sputum.  GASTROINTESTINAL: No anorexia, nausea, vomiting or diarrhea. No abdominal pain or blood.   GENITOURINARY: No burning on urination, no polyuria NEUROLOGICAL: No headache, dizziness, syncope, paralysis, ataxia, numbness or tingling in the extremities. No change in bowel or bladder control.  MUSCULOSKELETAL: No muscle, back pain, joint pain or stiffness.  LYMPHATICS: No enlarged nodes. No history of splenectomy.  PSYCHIATRIC: No history of depression or anxiety.  ENDOCRINOLOGIC: No reports of sweating, cold or heat intolerance. No polyuria or polydipsia.  Marland Kitchen   Physical Examination Today's Vitals   02/11/20 1319  BP: 114/78  Pulse: 76  SpO2: 99%  Weight: 219 lb 3.2 oz (99.4 kg)  Height: 5\' 9"  (1.753 m)   Body mass index is 32.37 kg/m.  Gen: resting comfortably, no acute distress HEENT: no scleral icterus, pupils equal round and reactive, no palptable cervical adenopathy,  CV: RRR, no m/r/g no jvd Resp: Clear to auscultation bilaterally GI: abdomen is soft, non-tender, non-distended, normal bowel sounds, no hepatosplenomegaly MSK: extremities are warm, no edema.  Skin: warm, no rash Neuro:  no focal deficits Psych: appropriate affect   Diagnostic Studies  08/2018 nuclear study Gated stress images:  Ejection fraction: >70%.  Wall motion abnormalities: None.   CONCLUSION:   1. No inducible ischemia.  2. Normal left ventricular function (EF >70%).    05/2012 cath  OTHER:Widely patent coronaries.  Normalleft main.  Normal left ventricular function.    08/2011 [facility] EP ablation Summary:  1. NSR at baseline.  2. Spontaneous PVC of LBBB morphology, inferior and predominately rightward  (isoelectric in I, negative in aVL) directed axis identified. Mapping in the  LVOT at the level of the AV was 09/2011 later than surface activation. RVOT  mapping confirmed PVC's originated from area 3 of the RVOT and was  pre-QRS. RF application here induced automaticity with a 12/12 match to the  clinical PVC. The clinical PVC was not seen after the first  lesion despite  aggressive eipnephrine challenge.  3. No overt complications   TTE 2005 SUMMARY - Overall left ventricular systolic function was normal. Left ventricular ejection fraction was estimated , range being 55 to 65 %.. This study was inadequate for the evaluation of left ventricular regional wall motion. - Extremely limited due to poor sound wave transmission; LV function appears to be preserved; focal wall motion abnormality cannot be excluded; suggest TEE or MUGA if    clinically indicated.  NMS test: NOVANT 2001 IMPRESSION NEGATIVE FOR ISCHEMIA. LEFT VENTRICULAR EJECTION FRACTION 58%.     Assessment and Plan  1. Neurocardiogenic syncope - no syncopal episodes recently but several presycnopal episodes - has pacemaker placed by [facility], normal recent function - continue to encourage aggressive oral hydration, salt intake - given Rx for compression stockings - start florinef 0.1mg  daily       2006, M.D..

## 2020-02-18 ENCOUNTER — Telehealth: Payer: Self-pay | Admitting: Emergency Medicine

## 2020-02-18 NOTE — Telephone Encounter (Signed)
LMOM to call DC and # provided. [employer] alert for monitor disconnected for 21 days. Patient does not read or write.

## 2020-03-02 NOTE — Telephone Encounter (Signed)
I told the patient to plug the monitor up close to his bed. I told him to call us tomorrow to see if it connected. I told him if it do not update by tomorrow he will have to call [employer].

## 2020-03-02 NOTE — Telephone Encounter (Signed)
Spoke to [employer], Wife.  Requested patient call back. Direct phone number provided.

## 2020-03-03 ENCOUNTER — Telehealth: Payer: Self-pay

## 2020-03-03 NOTE — Telephone Encounter (Signed)
Episode noted Continue to monitor

## 2020-03-03 NOTE — Telephone Encounter (Signed)
B[employer] alert received for 1 SVT event logged on 03/02/20 @ 12:48.   Patient reports he was aware of the event and felt diaphoretic, fatigue, palpitations and nausea. Denies any chest discomfort or shortness of breath. Reports he was sitting around not doing strenuous activity. Reports he is staying well hydrated and adequate sale intake. ED precautions given. Reports I will forward to Dr. Johney Frame and we will call with changes.

## 2020-03-08 NOTE — Telephone Encounter (Signed)
Patients Remote monitor is connected as of 03/08/20.

## 2020-05-26 ENCOUNTER — Telehealth: Payer: Self-pay

## 2020-05-26 NOTE — Telephone Encounter (Signed)
B[employer] alert received for no message within 21 days.  Attempted to contact patient in regards to alert. No answer, LMOVM.

## 2020-05-27 NOTE — Telephone Encounter (Signed)
Pt is returning call.  

## 2020-05-27 NOTE — Telephone Encounter (Signed)
Contacted patient and his monitor possibly being unplugged or unconnected. Patient advised that his monitor was plugged in but did advised that the battery bar was showing 3 black lines. I advised him, that I would consult with my monitor specialist and give him a call back on Monday to see if we get a recent transmission or if we need to order a new home monitor.

## 2020-06-01 NOTE — Telephone Encounter (Signed)
LMOVM for patient to call [employer] tech support about his monitor. I left biotronik number, and the device clinic number for the patient.

## 2020-06-13 ENCOUNTER — Encounter: Payer: Self-pay | Admitting: Cardiology

## 2020-06-13 ENCOUNTER — Ambulatory Visit (INDEPENDENT_AMBULATORY_CARE_PROVIDER_SITE_OTHER): Payer: Medicare HMO | Admitting: Cardiology

## 2020-06-13 VITALS — BP 100/70 | HR 70 | Ht 69.0 in | Wt 217.2 lb

## 2020-06-13 DIAGNOSIS — R55 Syncope and collapse: Secondary | ICD-10-CM | POA: Diagnosis not present

## 2020-06-13 MED ORDER — FLUDROCORTISONE ACETATE 0.1 MG PO TABS
0.1000 mg | ORAL_TABLET | Freq: Every day | ORAL | 1 refills | Status: DC
Start: 2020-06-13 — End: 2021-10-25

## 2020-06-13 NOTE — Progress Notes (Signed)
Clinical Summary [name] is a 60 y.o.male seen today for follow up of the following medical problems.    Previously followed at [facility]   1. Neurocardiogenic Syncope - ongoing several years, roughly 10 years - previusly followed at [facility] - from there notes history of neurocardiogenic syncope, had pacemaker placed for this reason.  - some mild presyncopal episodes.Several time a week. Often with standing up - stays well hydrated.    - last visit was to start  florinef 0.1mg  daily however did not fill the Rx - still with dizziness at times, often significant episodes when on the commode having a bowel movement.   2. Chronic chest pain - 08/2018 nuclear stress: no ischemia - notes mention chronic chest pain nearly 10 years. Multiple stress tests negative, prior cath with normal coroarnies.    3. Pacemaker - placd 2013 at [facility] - dual chamber Biotronik - now followed by Dr Johney Frame, normal recent device check.    3. PSVT - [facility] notes mention prior ablation in 2013      Past Medical History:  Diagnosis Date  . Anginal pain (HCC)    "EVERY SINGLE DAY" (05/04/2015)  . Arthritis    "fingers; ankles" (05/04/2015)  . COPD (chronic obstructive pulmonary disease) (HCC)   . Daily headache    "hopefully this neck OR will help" (05/04/2015)  . Depression   . GERD (gastroesophageal reflux disease)   . History of gout   . History of hiatal hernia   . History of shingles   . Hypertension    has never been on meds  . Neck pain    buldging disc  . Presence of permanent cardiac pacemaker   . Sick sinus syndrome (HCC)   . Vasovagal syncope    last one Jan 2017     Allergies  Allergen Reactions  . No Known Allergies      Current Outpatient Medications  Medication Sig Dispense Refill  . aspirin EC 81 MG tablet Take 81 mg by mouth daily. Swallow whole.    . fludrocortisone (FLORINEF) 0.1 MG tablet Take 1 tablet (0.1 mg total) by mouth  daily. 90 tablet 1  . nitroGLYCERIN (NITROSTAT) 0.4 MG SL tablet Place 1 tablet (0.4 mg total) under the tongue every 5 (five) minutes x 3 doses as needed for chest pain (if no relief after 2nd dose, proceed to the ED for an evaluation or call 911). 25 tablet 2   No current facility-administered medications for this visit.     Past Surgical History:  Procedure Laterality Date  . ANTERIOR CERVICAL DECOMP/DISCECTOMY FUSION  05/04/2015  . ANTERIOR CERVICAL DECOMP/DISCECTOMY FUSION N/A 05/04/2015   Procedure: C6-7 Anterior Cervical Discectomy and Fusion, Allograft, Plate ;  Surgeon: Eldred Manges, MD;  Location: [facility] OR;  Service: Orthopedics;  Laterality: N/A;  . BACK SURGERY    . CARDIAC CATHETERIZATION     2014  . CARDIAC ELECTROPHYSIOLOGY MAPPING AND ABLATION    . CARDIAC ELECTROPHYSIOLOGY STUDY & DFT    . HERNIA REPAIR    . LOOP RECORDER INSERTION  07/1999   [name] 07/05/2010  . LOOP RECORDER REMOVAL  07/2001   [name] 07/05/2010  . PACEMAKER IMPLANT  03/01/2011   Biotronik Evia implanted by Dr Luella Cook at [place] for sick sinus syndrome  . POSTERIOR CERVICAL FUSION/FORAMINOTOMY N/A 04/20/2016   Procedure: c6-7 posterior fusion, ILIAC BONE MARROW ASPIRATE, WIRING;  Surgeon: Eldred Manges, MD;  Location: [facility] OR;  Service:  Orthopedics;  Laterality: N/A;  . SHOULDER ARTHROSCOPY W/ ROTATOR CUFF REPAIR Right   . UMBILICAL HERNIA REPAIR       Allergies  Allergen Reactions  . No Known Allergies       Family History  Problem Relation Age of Onset  . Diabetes Mother   . Hypertension Mother   . Hypertension Father   . COPD Father   . COPD Brother   . Coronary artery disease Neg Hx      Social History Mr. Marcello reports that he has quit smoking. His smoking use included cigarettes. He has a 0.24 pack-year smoking history. He has never used smokeless tobacco. Mr. Hietala reports current alcohol use.   Review of Systems CONSTITUTIONAL: No weight loss, fever, chills, weakness or fatigue.   HEENT: Eyes: No visual loss, blurred vision, double vision or yellow sclerae.No hearing loss, sneezing, congestion, runny nose or sore throat.  SKIN: No rash or itching.  CARDIOVASCULAR: per hpi RESPIRATORY: No shortness of breath, cough or sputum.  GASTROINTESTINAL: No anorexia, nausea, vomiting or diarrhea. No abdominal pain or blood.  GENITOURINARY: No burning on urination, no polyuria NEUROLOGICAL: per hpi MUSCULOSKELETAL: No muscle, back pain, joint pain or stiffness.  LYMPHATICS: No enlarged nodes. No history of splenectomy.  PSYCHIATRIC: No history of depression or anxiety.  ENDOCRINOLOGIC: No reports of sweating, cold or heat intolerance. No polyuria or polydipsia.  Marland Kitchen   Physical Examination Today's Vitals   06/13/20 1406  BP: 100/70  Pulse: 70  SpO2: 98%  Weight: 217 lb 3.2 oz (98.5 kg)  Height: 5\' 9"  (1.753 m)   Body mass index is 32.07 kg/m.  Gen: resting comfortably, no acute distress HEENT: no scleral icterus, pupils equal round and reactive, no palptable cervical adenopathy,  CV: RRR, no m/r/g no jvd Resp: Clear to auscultation bilaterally GI: abdomen is soft, non-tender, non-distended, normal bowel sounds, no hepatosplenomegaly MSK: extremities are warm, no edema.  Skin: warm, no rash Neuro:  no focal deficits Psych: appropriate affect   Diagnostic Studies  08/2018 nuclear study Gated stress images:  Ejection fraction: >70%.  Wall motion abnormalities: None.   CONCLUSION:   1. No inducible ischemia.  2. Normal left ventricular function (EF >70%).    05/2012 cath  OTHER:Widely patent coronaries.  Normalleft main.  Normal left ventricular function.    08/2011 [facility] EP ablation Summary:  1. NSR at baseline.  2. Spontaneous PVC of LBBB morphology, inferior and predominately rightward  (isoelectric in I, negative in aVL) directed axis identified. Mapping in the  LVOT at the level of the AV was 09/2011 later than surface activation. RVOT   mapping confirmed PVC's originated from area 3 of the RVOT and was  pre-QRS. RF application here induced automaticity with a 12/12 match to the  clinical PVC. The clinical PVC was not seen after the first lesion despite  aggressive eipnephrine challenge.  3. No overt complications   TTE 2005 SUMMARY - Overall left ventricular systolic function was normal. Left ventricular ejection fraction was estimated , range being 55 to 65 %.. This study was inadequate for the evaluation of left ventricular regional wall motion. - Extremely limited due to poor sound wave transmission; LV function appears to be preserved; focal wall motion abnormality cannot be excluded; suggest TEE or MUGA if    clinically indicated.  NMS test: NOVANT 2001 IMPRESSION NEGATIVE FOR ISCHEMIA. LEFT VENTRICULAR EJECTION FRACTION 58%.    Assessment and Plan  1. Neurocardiogenic syncope - no syncopal episodes recently  but several presycnopal episodes - we will try again to start florinef 0.1mg  daily. COntinue aggressive hydration, compression stockings, pacemaker followed by EP       Antoine Poche, M.D.

## 2020-06-13 NOTE — Patient Instructions (Signed)
Your physician recommends that you schedule a follow-up appointment in: 6 MONTHS WITH DR BRANCH  Your physician recommends that you continue on your current medications as directed. Please refer to the Current Medication list given to you today.  Thank you for choosing [facility]!!    

## 2020-06-15 NOTE — Telephone Encounter (Signed)
Spoke with wife Marylu Lund [facility]) who reports patient did call [employer] and he will be receiving a new monitor within 7-10 buisness days. She reports patient has had some episodes of dizziness after going to the [place] , no syncope, CP, SOB  Or diaphoresis. She is unsure if he is urinating or having BM when episodes occur. After he lays down the dizziness stops. She will contact the office when his new monitor arrives and she was provided with the device clinic #. ED precautions given and instructed to call the office if has another episode of dizziness with syncope to schedule in person device check with EP APP.

## 2020-07-06 ENCOUNTER — Other Ambulatory Visit: Payer: Self-pay

## 2020-07-06 ENCOUNTER — Emergency Department (HOSPITAL_COMMUNITY)
Admission: EM | Admit: 2020-07-06 | Discharge: 2020-07-06 | Disposition: A | Discharge status: Home / Self Care | Payer: Medicare HMO | Attending: Emergency Medicine | Admitting: Emergency Medicine

## 2020-07-06 ENCOUNTER — Encounter (HOSPITAL_COMMUNITY): Payer: Self-pay

## 2020-07-06 DIAGNOSIS — Z5321 Procedure and treatment not carried out due to patient leaving prior to being seen by health care provider: Secondary | ICD-10-CM | POA: Diagnosis not present

## 2020-07-06 DIAGNOSIS — R109 Unspecified abdominal pain: Secondary | ICD-10-CM | POA: Diagnosis present

## 2020-07-06 NOTE — ED Triage Notes (Signed)
Pt to er, pt c/o L flank/ abd pain, states that he has had the pain for the past two months, but today his pain was much worse.  Pt states that he was out int the yard when the pain got worse and now it is an intense constant pain.

## 2020-07-06 NOTE — ED Triage Notes (Deleted)
Pt to er, computer interpreter used, pt c/o headache and vomiting.  States that she has had the pain for the past hour. States that the pain started when she was coming home from work.  States that she has never had anything like this before.  Denies hx of migraines.

## 2020-12-08 NOTE — Progress Notes (Signed)
Cardiology Office Note  Date: 12/09/2020   ID: [name] [name], DOB [birth year], MRN [id number]  PCP:  Jason Coop, FNP  Cardiologist:  Dina Rich, MD Electrophysiologist:  None   Chief Complaint: 81-month follow-up  History of Present Illness: [name] [name] is a 60 y.o. male with a history of COPD, syncope and collapse, chest pain, sick sinus syndrome, HTN.  He was last seen by Dr. Wyline Mood on 06/13/2020 for neurocardiogenic syncope ongoing for around 10 years.  She had pacemaker placed with some mild presyncopal episodes several times a week occurred often with standing.  She was staying well-hydrated.  At prior visit he was to start Florinef 0.1 mg daily but did not feel the prescription.  He still having dizziness at times.  Often can occur when on the commode having a bowel movement.  History of chronic chest pain was low risk stress test July 2020.  Notes mention chronic chest pain nearly 10 years.  Multiple stress test were negative.  Prior cardiac catheterization with normal coronaries.  Pacemaker was placed in 2013 at [facility].  Had a normal recent device check.  Previous PSVT with prior ablation in 2013.  No recent syncopal episodes recently but several presyncopal episodes.  Plan was to try again to start Florinef 0.1 mg daily and continue aggressive hydration, compression stockings, pacemaker followed by EP.  He is here today for 32-month follow-up.  He states he has been doing reasonably well but still has these presyncopal episodes.  He continues to take Florinef as directed.  He still increasing the salt in his diet and hydrating.  He denies any true syncopal episodes but has some near syncope symptoms at times.  Blood pressure today is 112/70.  Heart rate of 68.  He denies any anginal symptoms, DOE or SOB, PND, orthopnea, denies any issues with his pacemaker.  He has an upcoming remote pacemaker check on December 5.  He has a physician pacemaker check on December 9  with Dr. Johney Frame.  Denies any CVA or TIA-like symptoms, palpitations or arrhythmias, bleeding issues, claudication-like symptoms, DVT or PE-like symptoms, lower extremity edema.   Past Medical History:  Diagnosis Date   Anginal pain (HCC)    "EVERY SINGLE DAY" (05/04/2015)   Arthritis    "fingers; ankles" (05/04/2015)   COPD (chronic obstructive pulmonary disease) (HCC)    Daily headache    "hopefully this neck OR will help" (05/04/2015)   Depression    GERD (gastroesophageal reflux disease)    History of gout    History of hiatal hernia    History of shingles    Hypertension    has never been on meds   Neck pain    buldging disc   Presence of permanent cardiac pacemaker    Sick sinus syndrome (HCC)    Vasovagal syncope    last one Jan 2017    Past Surgical History:  Procedure Laterality Date   ANTERIOR CERVICAL DECOMP/DISCECTOMY FUSION  05/04/2015   ANTERIOR CERVICAL DECOMP/DISCECTOMY FUSION N/A 05/04/2015   Procedure: C6-7 Anterior Cervical Discectomy and Fusion, Allograft, Plate ;  Surgeon: Eldred Manges, MD;  Location: [facility] OR;  Service: Orthopedics;  Laterality: N/A;   BACK SURGERY     CARDIAC CATHETERIZATION     2014   CARDIAC ELECTROPHYSIOLOGY MAPPING AND ABLATION     CARDIAC ELECTROPHYSIOLOGY STUDY & DFT     HERNIA REPAIR     LOOP RECORDER INSERTION  07/1999   [name] 07/05/2010  LOOP RECORDER REMOVAL  07/2001   [name] 07/05/2010   PACEMAKER IMPLANT  03/01/2011   Biotronik Evia implanted by Dr Luella Cook at [facility] for sick sinus syndrome   POSTERIOR CERVICAL FUSION/FORAMINOTOMY N/A 04/20/2016   Procedure: c6-7 posterior fusion, ILIAC BONE MARROW ASPIRATE, WIRING;  Surgeon: Eldred Manges, MD;  Location: [facility] OR;  Service: Orthopedics;  Laterality: N/A;   SHOULDER ARTHROSCOPY W/ ROTATOR CUFF REPAIR Right    UMBILICAL HERNIA REPAIR      Current Outpatient Medications  Medication Sig Dispense Refill   aspirin EC 81 MG tablet Take 81 mg by mouth daily. Swallow whole.      fludrocortisone (FLORINEF) 0.1 MG tablet Take 1 tablet (0.1 mg total) by mouth daily. 90 tablet 1   nitroGLYCERIN (NITROSTAT) 0.4 MG SL tablet Place 1 tablet (0.4 mg total) under the tongue every 5 (five) minutes x 3 doses as needed for chest pain (if no relief after 2nd dose, proceed to the ED for an evaluation or call 911). 25 tablet 2   No current facility-administered medications for this visit.   Allergies:  No known allergies   Social History: The patient  reports that he has quit smoking. His smoking use included cigarettes. He has a 0.24 pack-year smoking history. He has never used smokeless tobacco. He reports current alcohol use. He reports current drug use. Frequency: 3.00 times per week. Drug: Marijuana.   Family History: The patient's family history includes COPD in his brother and father; Diabetes in his mother; Hypertension in his father and mother.   ROS:  Please see the history of present illness. Otherwise, complete review of systems is positive for none.  All other systems are reviewed and negative.   Physical Exam: VS:  BP 112/70   Pulse 68   Ht 5\' 9"  (1.753 m)   Wt 214 lb 12.8 oz (97.4 kg)   SpO2 95%   BMI 31.72 kg/m , BMI Body mass index is 31.72 kg/m.  Wt Readings from Last 3 Encounters:  12/09/20 214 lb 12.8 oz (97.4 kg)  06/13/20 217 lb 3.2 oz (98.5 kg)  02/11/20 219 lb 3.2 oz (99.4 kg)    General: Patient appears comfortable at rest. Neck: Supple, no elevated JVP or carotid bruits, no thyromegaly. Lungs: Clear to auscultation, nonlabored breathing at rest. Cardiac: Regular rate and rhythm, no S3 or significant systolic murmur, no pericardial rub. Extremities: No pitting edema, distal pulses 2+. Skin: Warm and dry. Musculoskeletal: No kyphosis. Neuropsychiatric: Alert and oriented x3, affect grossly appropriate.  ECG:    Recent Labwork: No results found for requested labs within last 8760 hours.  No results found for: CHOL, TRIG, HDL, CHOLHDL, VLDL,  LDLCALC, LDLDIRECT  Other Studies Reviewed Today:   08/2018 nuclear study Gated stress images:  Ejection fraction: >70%.  Wall motion abnormalities: None.   CONCLUSION:   1.  No inducible ischemia.  2.  Normal left ventricular function (EF >70%).       05/2012 cath   OTHER:  Widely patent coronaries.  Normal left main.  Normal left ventricular function.       08/2011 [facility] EP ablation Summary:   1. NSR at baseline.  2. Spontaneous PVC of LBBB morphology, inferior and predominately rightward  (isoelectric in I, negative in aVL) directed axis identified. Mapping in the  LVOT at the level of the AV was 09/2011 later than surface activation. RVOT  mapping confirmed PVC's originated from area 3 of the RVOT and was  pre-QRS.  RF application here induced automaticity with a 12/12 match to the  clinical PVC. The clinical PVC was not seen after the first lesion despite  aggressive eipnephrine challenge.  3. No overt complications    TTE 2005 SUMMARY -  Overall left ventricular systolic function was normal. Left ventricular ejection fraction was estimated , range being 55 to 65 %.. This study was inadequate for the evaluation of left ventricular regional wall motion. -  Extremely limited due to poor sound wave transmission; LV function appears to be preserved; focal wall motion abnormality cannot be excluded; suggest TEE or MUGA if       clinically indicated.  NMS test: NOVANT 2001 IMPRESSION NEGATIVE FOR ISCHEMIA. LEFT VENTRICULAR EJECTION FRACTION 58%.      Assessment and Plan:  1. Neurocardiogenic syncope   2. Pacemaker    1. Neurocardiogenic syncope Denies any recent syncopal episodes but does state he has near syncopal symptoms occasionally.  He continues taking Florinef as directed.  He states he is maintaining increased salt in his diet and attempting to hydrate well.  Blood pressure today is 112/70.  Heart rate is 68.  Continue Florinef 0.1 mg p.o. daily.  2.  Pacemaker Denies any issues with his pacemaker.  He has an upcoming remote device check on December 5 and follow-up physician pacer check on December 9 with Dr. Johney Frame.  Medication Adjustments/Labs and Tests Ordered: Current medicines are reviewed at length with the patient today.  Concerns regarding medicines are outlined above.   Disposition: Follow-up with Dr. Wyline Mood or APP 6 months  Signed, Rennis Harding, NP 12/09/2020 4:18 PM    [facility] [address] [place], [place], [place] [address] Phone: [phone]; Fax: [phone]

## 2020-12-09 ENCOUNTER — Other Ambulatory Visit: Payer: Self-pay

## 2020-12-09 ENCOUNTER — Encounter: Payer: Self-pay | Admitting: Family Medicine

## 2020-12-09 ENCOUNTER — Ambulatory Visit (INDEPENDENT_AMBULATORY_CARE_PROVIDER_SITE_OTHER): Payer: Medicare HMO | Admitting: Family Medicine

## 2020-12-09 VITALS — BP 112/70 | HR 68 | Ht 69.0 in | Wt 214.8 lb

## 2020-12-09 DIAGNOSIS — R55 Syncope and collapse: Secondary | ICD-10-CM | POA: Diagnosis not present

## 2020-12-09 DIAGNOSIS — Z950 Presence of cardiac pacemaker: Secondary | ICD-10-CM | POA: Diagnosis not present

## 2020-12-09 NOTE — Patient Instructions (Addendum)

## 2020-12-30 ENCOUNTER — Other Ambulatory Visit (HOSPITAL_COMMUNITY): Payer: Self-pay | Admitting: Family

## 2020-12-30 ENCOUNTER — Other Ambulatory Visit: Payer: Self-pay

## 2020-12-30 ENCOUNTER — Ambulatory Visit (HOSPITAL_COMMUNITY)
Admission: RE | Admit: 2020-12-30 | Discharge: 2020-12-30 | Disposition: A | Discharge status: Home / Self Care | Payer: Medicare HMO | Source: Ambulatory Visit | Attending: Family | Admitting: Family

## 2020-12-30 DIAGNOSIS — R059 Cough, unspecified: Secondary | ICD-10-CM | POA: Insufficient documentation

## 2021-01-20 ENCOUNTER — Encounter: Payer: Medicare HMO | Admitting: Internal Medicine

## 2021-01-27 ENCOUNTER — Encounter: Payer: Medicare HMO | Admitting: Internal Medicine

## 2021-03-03 ENCOUNTER — Telehealth: Payer: Self-pay | Admitting: Internal Medicine

## 2021-03-03 NOTE — Telephone Encounter (Signed)
Numerous attempts to contact patient with recall letters. Unable to reach by telephone. with no success.   

## 2021-04-06 ENCOUNTER — Telehealth: Payer: Self-pay | Admitting: Cardiology

## 2021-04-06 MED ORDER — NITROGLYCERIN 0.4 MG SL SUBL: 0.4000 mg | SUBLINGUAL_TABLET | SUBLINGUAL | 2 refills | Status: AC | PRN

## 2021-04-06 NOTE — Telephone Encounter (Signed)
°*  STAT* If patient is at the pharmacy, call can be transferred to refill team.   1. Which medications need to be refilled? (please list name of each medication and dose if known) nitroGLYCERIN (NITROSTAT) 0.4 MG SL tablet  2. Which pharmacy/location (including street and city if local pharmacy) is medication to be sent to? [id number] - [place], [place] - [address]  3. Do they need a 30 day or 90 day supply? 90

## 2021-05-26 ENCOUNTER — Ambulatory Visit (INDEPENDENT_AMBULATORY_CARE_PROVIDER_SITE_OTHER): Payer: Medicare HMO | Admitting: Internal Medicine

## 2021-05-26 ENCOUNTER — Encounter: Payer: Self-pay | Admitting: Internal Medicine

## 2021-05-26 VITALS — BP 128/88 | HR 74 | Ht 69.0 in | Wt 219.6 lb

## 2021-05-26 DIAGNOSIS — G4733 Obstructive sleep apnea (adult) (pediatric): Secondary | ICD-10-CM

## 2021-05-26 DIAGNOSIS — Z950 Presence of cardiac pacemaker: Secondary | ICD-10-CM | POA: Diagnosis not present

## 2021-05-26 NOTE — Progress Notes (Signed)
? ? ?PCP: Jason Coop, FNP ?Primary Cardiologist: Dr Wyline Mood ?Primary EP:  Dr Johney Frame ? ?[name] [name] is a 61 y.o. male who presents today for routine electrophysiology followup.  Since last being seen in our clinic, the patient reports doing very well.  He has longstanding vagal syncope.  He was previously followed by Dr Bard Herbert at had PPM implanted.  Today, he denies symptoms of palpitations, chest pain, shortness of breath,  lower extremity edema, dizziness, presyncope, or syncope.  His concern today is with snoring and daytime somnolence.  He is frequently sleepy in the afternoon.  He feels that he could fall asleep driving but has not done so.  He is not well rested upon waking in the morning. The patient is otherwise without complaint today.  ? ?Past Medical History:  ?Diagnosis Date  ? Anginal pain (HCC)   ? "EVERY SINGLE DAY" (05/04/2015)  ? Arthritis   ? "fingers; ankles" (05/04/2015)  ? COPD (chronic obstructive pulmonary disease) (HCC)   ? Daily headache   ? "hopefully this neck OR will help" (05/04/2015)  ? Depression   ? GERD (gastroesophageal reflux disease)   ? History of gout   ? History of hiatal hernia   ? History of shingles   ? Hypertension   ? has never been on meds  ? Neck pain   ? buldging disc  ? Presence of permanent cardiac pacemaker   ? Sick sinus syndrome (HCC)   ? Vasovagal syncope   ? last one Jan 2017  ? ?Past Surgical History:  ?Procedure Laterality Date  ? ANTERIOR CERVICAL DECOMP/DISCECTOMY FUSION  05/04/2015  ? ANTERIOR CERVICAL DECOMP/DISCECTOMY FUSION N/A 05/04/2015  ? Procedure: C6-7 Anterior Cervical Discectomy and Fusion, Allograft, Plate ;  Surgeon: Eldred Manges, MD;  Location: [facility] OR;  Service: Orthopedics;  Laterality: N/A;  ? BACK SURGERY    ? CARDIAC CATHETERIZATION    ? 2014  ? CARDIAC ELECTROPHYSIOLOGY MAPPING AND ABLATION    ? CARDIAC ELECTROPHYSIOLOGY STUDY & DFT    ? HERNIA REPAIR    ? LOOP RECORDER INSERTION  07/1999  ? [name] 07/05/2010  ? LOOP RECORDER  REMOVAL  07/2001  ? [name] 07/05/2010  ? PACEMAKER IMPLANT  03/01/2011  ? Biotronik Evia implanted by Dr Luella Cook at [facility] for sick sinus syndrome  ? POSTERIOR CERVICAL FUSION/FORAMINOTOMY N/A 04/20/2016  ? Procedure: c6-7 posterior fusion, ILIAC BONE MARROW ASPIRATE, WIRING;  Surgeon: Eldred Manges, MD;  Location: [facility] OR;  Service: Orthopedics;  Laterality: N/A;  ? SHOULDER ARTHROSCOPY W/ ROTATOR CUFF REPAIR Right   ? UMBILICAL HERNIA REPAIR    ? ? ?ROS- all systems are reviewed and negative except as per HPI above ? ?Current Outpatient Medications  ?Medication Sig Dispense Refill  ? aspirin EC 81 MG tablet Take 81 mg by mouth daily. Swallow whole.    ? ibuprofen (ADVIL) 800 MG tablet Take 800 mg by mouth every 8 (eight) hours as needed.    ? nitroGLYCERIN (NITROSTAT) 0.4 MG SL tablet Place 1 tablet (0.4 mg total) under the tongue every 5 (five) minutes x 3 doses as needed for chest pain (if no relief after 2nd dose, proceed to the ED or call 911). 25 tablet 2  ? fludrocortisone (FLORINEF) 0.1 MG tablet Take 1 tablet (0.1 mg total) by mouth daily. (Patient not taking: Reported on 05/26/2021) 90 tablet 1  ? ?No current facility-administered medications for this visit.  ? ? ?Physical Exam: ?Vitals:  ? 05/26/21 1405  ?  BP: 128/88  ?Pulse: 74  ?SpO2: 96%  ?Weight: 219 lb 9.6 oz (99.6 kg)  ?Height: 5\' 9"  (1.753 m)  ? ? ?GEN- The patient is well appearing, alert and oriented x 3 today.   ?Head- normocephalic, atraumatic ?Eyes-  Sclera clear, conjunctiva pink ?Ears- hearing intact ?Oropharynx- clear ?Lungs- Clear to ausculation bilaterally, normal work of breathing ?Chest- pacemaker pocket is well healed ?Heart- Regular rate and rhythm, no murmurs, rubs or gallops, PMI not laterally displaced ?GI- soft, NT, ND, + BS ?Extremities- no clubbing, cyanosis, or edema ? ?Pacemaker interrogation- reviewed in detail today,  See PACEART report ? ?ekg tracing ordered today is personally reviewed and shows atrial paced ? ?Assessment and  Plan: ? ?1. Symptomatic sinus bradycardia  ?Normal pacemaker function ?See Art report ?No changes today ?he is not device dependant today ?Some lead noise is noted ?He has not been compliant with remote monitoring.  We have stressed the importance of compliance with remotes today ? ?2. Vasovagal syncope ?Improved s/p PPM  ?Denies any recent events ?He continues supportive care with salt liberalization and hydration ? ?3. HTN ?Stable ?No change required today ? ?4. PVCs ?S/p prior ablation at [facility] ? ?5. Snoring/ daytime somnolence ?As above ?We will order a sleep study ? ?Risks, benefits and potential toxicities for medications prescribed and/or refilled reviewed with patient today.  ? ?Return in a year   ? ? ?[facility] MD, FACC ?05/26/2021 ?2:22 PM ? ? ?

## 2021-05-26 NOTE — Patient Instructions (Signed)
Medication Instructions:  ?Continue all current medications. ? ?Labwork: ?none ? ?Testing/Procedures: ?none ? ?Follow-Up: ?1 year - Dr.  Johney Frame  ? ?Any Other Special Instructions Will Be Listed Below (If Applicable). ?You have been referred to:  pulmonary for sleep study  ? ?If you need a refill on your cardiac medications before your next appointment, please call your pharmacy. ? ?

## 2021-06-05 ENCOUNTER — Telehealth: Payer: Self-pay

## 2021-06-05 NOTE — Telephone Encounter (Signed)
I called Dr. Sharol Harness office to have the patient deactivated. The receptionist took a message and someone will call us back. I found an interrogation and the PID number is [id number]. Once the patient is deactivated. Shayna and I will put the patient in together.  ?

## 2021-06-23 NOTE — Telephone Encounter (Signed)
The patient is now in our system but needs to come into the office to be get home monitor programmed back on in his device. If he is on the device clinic schedule he they will need Biotronik present.  ? ?I tried to get him on the device clinic schedule today but no answer. I did leave a message for the patient to give me a call back so I can get him scheduled. ?

## 2021-07-07 ENCOUNTER — Telehealth: Payer: Self-pay

## 2021-07-07 NOTE — Telephone Encounter (Signed)
-----   Message from Graciella Freer, PA-C sent at 07/07/2021  9:58 AM EDT ----- Please make sure pt brings his home monitor (white phone like object) to his visit next week.   If he does not have one will need industry made aware to provide  Thank you!

## 2021-07-07 NOTE — Telephone Encounter (Signed)
Called and spoke to patient wife to bring home monitor when he comes to apt. Voiced understanding.

## 2021-07-13 ENCOUNTER — Encounter: Payer: Self-pay | Admitting: Student

## 2021-07-13 ENCOUNTER — Ambulatory Visit (INDEPENDENT_AMBULATORY_CARE_PROVIDER_SITE_OTHER): Payer: Medicare HMO | Admitting: Student

## 2021-07-13 VITALS — BP 118/74 | HR 76 | Ht 69.0 in | Wt 222.4 lb

## 2021-07-13 DIAGNOSIS — Z950 Presence of cardiac pacemaker: Secondary | ICD-10-CM | POA: Diagnosis not present

## 2021-07-13 DIAGNOSIS — G4733 Obstructive sleep apnea (adult) (pediatric): Secondary | ICD-10-CM

## 2021-07-13 DIAGNOSIS — I495 Sick sinus syndrome: Secondary | ICD-10-CM

## 2021-07-13 DIAGNOSIS — R55 Syncope and collapse: Secondary | ICD-10-CM | POA: Diagnosis not present

## 2021-07-13 LAB — CUP PACEART INCLINIC DEVICE CHECK
Date Time Interrogation Session: 20230525095333
Implantable Lead Implant Date: 20130110
Implantable Lead Implant Date: 20130110
Implantable Lead Location: 753859
Implantable Lead Location: 753860
Implantable Lead Model: 350
Implantable Lead Model: 350
Implantable Pulse Generator Implant Date: 20130110
Pulse Gen Serial Number: 66254513

## 2021-07-13 NOTE — Progress Notes (Signed)
PCP: [name] Coop, FNP Primary Cardiologist: Dr [name] [name] [name] Primary EP:  Dr [name] [name] [name]  [name] [name] [name] is a 61 y.o. male who presents today for routine electrophysiology followup to reactivate his remote monitoring.   No new symptoms since seen by Dr. Johney [name] last month. He is pending sleep study. He denies symptoms of palpitations, chest pain, SOB, lower extremity edema, dizziness, presyncope, or syncope. He presented today for device interrogation/re-activation  Past Medical History:  Diagnosis Date   Anginal pain (HCC)    "EVERY SINGLE DAY" (05/04/2015)   Arthritis    "fingers; ankles" (05/04/2015)   COPD (chronic obstructive pulmonary disease) (HCC)    Daily headache    "hopefully this neck OR will help" (05/04/2015)   Depression    GERD (gastroesophageal reflux disease)    History of gout    History of hiatal hernia    History of shingles    Hypertension    has never been on meds   Neck pain    buldging disc   Presence of permanent cardiac pacemaker    Sick sinus syndrome (HCC)    Vasovagal syncope    last one Jan 2017   Past Surgical History:  Procedure Laterality Date   ANTERIOR CERVICAL DECOMP/DISCECTOMY FUSION  05/04/2015   ANTERIOR CERVICAL DECOMP/DISCECTOMY FUSION N/A 05/04/2015   Procedure: C6-7 Anterior Cervical Discectomy and Fusion, Allograft, Plate ;  Surgeon: [name] Manges, MD;  Location: [facility] OR;  Service: Orthopedics;  Laterality: N/A;   BACK SURGERY     CARDIAC CATHETERIZATION     2014   CARDIAC ELECTROPHYSIOLOGY MAPPING AND ABLATION     CARDIAC ELECTROPHYSIOLOGY STUDY & DFT     HERNIA REPAIR     LOOP RECORDER INSERTION  07/1999   [name] [name] [name] 07/05/2010   LOOP RECORDER REMOVAL  07/2001   [name] [name] [name] 07/05/2010   PACEMAKER IMPLANT  03/01/2011   Biotronik Evia implanted by Dr [name] [name] [name] at [facility] for sick sinus syndrome   POSTERIOR CERVICAL FUSION/FORAMINOTOMY N/A 04/20/2016   Procedure: c6-7 posterior fusion, ILIAC BONE MARROW ASPIRATE, WIRING;  Surgeon: [name] Manges, MD;  Location: [facility] OR;  Service: Orthopedics;  Laterality: N/A;   SHOULDER ARTHROSCOPY W/ ROTATOR CUFF REPAIR [name] [name]    UMBILICAL HERNIA REPAIR      Review of systems complete and found to be negative unless listed in HPI.    Current Outpatient Medications  Medication Sig Dispense Refill   aspirin EC 81 MG tablet Take 81 mg by mouth daily. Swallow whole.     fludrocortisone (FLORINEF) 0.1 MG tablet Take 1 tablet (0.1 mg total) by mouth daily. 90 tablet 1   ibuprofen (ADVIL) 800 MG tablet Take 800 mg by mouth every 8 (eight) hours as needed.     nitroGLYCERIN (NITROSTAT) 0.4 MG SL tablet Place 1 tablet (0.4 mg total) under the tongue every 5 (five) minutes x 3 doses as needed for chest pain (if no relief after 2nd dose, proceed to the ED or call 911). 25 tablet 2   No current facility-administered medications for this visit.    Physical Exam: Vitals:   07/13/21 0916  BP: 118/74  Pulse: 76  SpO2: 98%  Weight: 222 lb 6.4 oz (100.9 kg)  Height: 5\' 9"  (1.753 m)    General:  Well appearing. No resp difficulty. HEENT: Normal Neck: Supple. JVP 5-6. Carotids 2+ bilat; no bruits. No thyromegaly or nodule noted. Cor: PMI nondisplaced. RRR, No M/G/R noted Lungs: CTAB, normal effort. Abdomen: Soft, non-tender,  non-distended, no HSM. No bruits or masses. +BS   Extremities: No cyanosis, clubbing, or rash. R and LLE no edema.  Neuro: Alert & orientedx3, cranial nerves grossly intact. moves all 4 extremities w/o difficulty. Affect pleasant   Pacemaker interrogation- reviewed in detail today,  See PACEART report  Assessment and Plan:  1. Symptomatic sinus bradycardia  Normal pacemaker function today.  See [name] [name] [name] Art report No changes today he is not device dependant today Some lead noise is noted, chronic.  His remote monitor had timed out. This was re-activated today.   2. Vasovagal syncope Improved s/p PPM  No recent events  3. HTN Stable on current regimen   4. PVCs S/p  prior ablation at [facility]  5. Snoring/ daytime somnolence Pending sleep study.   Remote monitoring reactivated. Return in a year  as previously directed   [name] [name] [name]" [name] [name] [name], [place]  07/13/2021 9:53 AM  07/13/2021 9:50 AM

## 2021-07-27 ENCOUNTER — Ambulatory Visit (INDEPENDENT_AMBULATORY_CARE_PROVIDER_SITE_OTHER): Payer: Medicare HMO | Admitting: Pulmonary Disease

## 2021-07-27 ENCOUNTER — Encounter: Payer: Self-pay | Admitting: Pulmonary Disease

## 2021-07-27 VITALS — BP 138/92 | HR 74 | Temp 98.7°F | Ht 69.0 in | Wt 218.6 lb

## 2021-07-27 DIAGNOSIS — R0683 Snoring: Secondary | ICD-10-CM | POA: Diagnosis not present

## 2021-07-27 NOTE — Progress Notes (Signed)
[facility]  Chief Complaint  Patient presents with   Consult    Sleep consult ref by Dr. Johney Frame patient was having trouble sleeping      Past Surgical History:  He  has a past surgical history that includes Cardiac electrophysiology study & DFT; Loop recorder insertion (07/1999); Loop recorder removal (07/2001); Cardiac electrophysiology mapping and ablation; Back surgery; Anterior cervical decomp/discectomy fusion (05/04/2015); Hernia repair; Umbilical hernia repair; Shoulder arthroscopy w/ rotator cuff repair (Right); Anterior cervical decomp/discectomy fusion (N/A, 05/04/2015); Cardiac catheterization; Posterior cervical fusion/foraminotomy (N/A, 04/20/2016); and PACEMAKER IMPLANT (03/01/2011).  Past Medical History:  COPD, Headache, GERD, Depression, Gout, HH, HTN, Shingles, SSS s/p PM  Constitutional:  BP (!) 138/92 (BP Location: Left Arm, Patient Position: Sitting)   Pulse 74   Temp 98.7 F (37.1 C)   Ht 5\' 9"  (1.753 m)   Wt 218 lb 9.6 oz (99.2 kg)   SpO2 97% Comment: ra  BMI 32.28 kg/m   Brief Summary:  [name] [name] is a 61 y.o. male former smoker with snoring.      Subjective:   He is here with his wife.  For the past year he has noticed more trouble feeling sleepy during the day.  He almost got into an accident because of this.  He is snoring more and wakes up feeling choked.    He goes to sleep at 10 pm.  He falls asleep quickly.  He wakes up 1 or 2 times to use the bathroom.  He gets out of bed at 5 am.  He feels tired in the morning.  He denies morning headache.  He does not use anything to help him stay awake.  He smokes marijuana and this helps him relax and fall asleep.  He has been getting leg cramps at night.  His father and daughter have sleep apnea.  He denies sleep walking, sleep talking, bruxism, or nightmares.  There is no history of restless legs.  He denies sleep hallucinations, sleep paralysis, or  cataplexy.  The Epworth score is 6 out of 24.   Physical Exam:   Appearance - well kempt   ENMT - no sinus tenderness, no oral exudate, no LAN, Mallampati 4 airway, no stridor  Respiratory - equal breath sounds bilaterally, no wheezing or rales  CV - s1s2 regular rate and rhythm, no murmurs  Ext - no clubbing, no edema  Skin - no rashes  Psych - normal mood and affect   Pulmonary testing:    Chest Imaging:    Sleep Tests:    Cardiac Tests:    Social History:  He  reports that he has quit smoking. His smoking use included cigarettes. He has a 0.24 pack-year smoking history. He has never used smokeless tobacco. He reports current alcohol use. He reports current drug use. Frequency: 3.00 times per week. Drug: Marijuana.  Family History:  His family history includes COPD in his brother and father; Diabetes in his mother; Hypertension in his father and mother.    Discussion:  He has snoring, sleep disruption, apnea, daytime sleepiness, and leg cramps.  She has history of hypertension and depression.  I am concerned he could have obstructive sleep apnea.  Assessment/Plan:   Snoring with excessive daytime sleepiness. - will need to arrange for a home sleep study  Obesity. - discussed how weight can impact sleep and risk for sleep disordered breathing - discussed options to assist with weight loss: combination of diet modification,  cardiovascular and strength training exercises  Cardiovascular risk. - had an extensive discussion regarding the adverse health consequences related to untreated sleep disordered breathing - specifically discussed the risks for hypertension, coronary artery disease, cardiac dysrhythmias, cerebrovascular disease, and diabetes - lifestyle modification discussed  Safe driving practices. - discussed how sleep disruption can increase risk of accidents, particularly when driving - safe driving practices were discussed  Therapies for  obstructive sleep apnea. - if the sleep study shows significant sleep apnea, then various therapies for treatment were reviewed: CPAP, oral appliance, and surgical interventions  Time Spent Involved in Patient Care on Day of Examination:  35 minutes  Follow up:   Patient Instructions  Will arrange for home sleep study Will call to arrange for follow up after sleep study reviewed   Medication List:   Allergies as of 07/27/2021       Reactions   No Known Allergies         Medication List        Accurate as of July 27, 2021 11:14 AM. If you have any questions, ask your nurse or doctor.          aspirin EC 81 MG tablet Take 81 mg by mouth daily. Swallow whole.   fludrocortisone 0.1 MG tablet Commonly known as: FLORINEF Take 1 tablet (0.1 mg total) by mouth daily.   ibuprofen 800 MG tablet Commonly known as: ADVIL Take 800 mg by mouth every 8 (eight) hours as needed.   nitroGLYCERIN 0.4 MG SL tablet Commonly known as: NITROSTAT Place 1 tablet (0.4 mg total) under the tongue every 5 (five) minutes x 3 doses as needed for chest pain (if no relief after 2nd dose, proceed to the ED or call 911).        Signature:  Coralyn Helling, MD [facility]/Critical Care Pager - [phone] 07/27/2021, 11:14 AM

## 2021-07-27 NOTE — Patient Instructions (Signed)
Will arrange for home sleep study Will call to arrange for follow up after sleep study reviewed  

## 2021-07-31 ENCOUNTER — Ambulatory Visit (INDEPENDENT_AMBULATORY_CARE_PROVIDER_SITE_OTHER): Payer: Medicare HMO

## 2021-07-31 DIAGNOSIS — I495 Sick sinus syndrome: Secondary | ICD-10-CM

## 2021-08-01 LAB — CUP PACEART REMOTE DEVICE CHECK
Date Time Interrogation Session: 20230612074620
Implantable Lead Implant Date: 20130110
Implantable Lead Implant Date: 20130110
Implantable Lead Location: 753859
Implantable Lead Location: 753860
Implantable Lead Model: 350
Implantable Lead Model: 350
Implantable Pulse Generator Implant Date: 20130110
Pulse Gen Serial Number: 66254513

## 2021-08-11 ENCOUNTER — Telehealth: Payer: Self-pay | Admitting: Cardiology

## 2021-08-24 NOTE — Progress Notes (Signed)
Remote pacemaker transmission.   

## 2021-09-27 ENCOUNTER — Telehealth: Payer: Self-pay

## 2021-09-27 NOTE — Telephone Encounter (Signed)
Biotronik alert received for device reached ERI 09/26/21.   Spoke to patients wife, advised a scheudler will call to speak to patient about need of apt with in-clinic provider. Voiced understanding.

## 2021-10-09 ENCOUNTER — Ambulatory Visit: Payer: Medicare HMO

## 2021-10-09 DIAGNOSIS — G4733 Obstructive sleep apnea (adult) (pediatric): Secondary | ICD-10-CM | POA: Diagnosis not present

## 2021-10-09 DIAGNOSIS — R0683 Snoring: Secondary | ICD-10-CM

## 2021-10-12 ENCOUNTER — Encounter: Payer: Self-pay | Admitting: Internal Medicine

## 2021-10-12 ENCOUNTER — Telehealth: Payer: Self-pay | Admitting: Pulmonary Disease

## 2021-10-12 ENCOUNTER — Ambulatory Visit (INDEPENDENT_AMBULATORY_CARE_PROVIDER_SITE_OTHER): Payer: Medicare HMO | Admitting: Internal Medicine

## 2021-10-12 ENCOUNTER — Other Ambulatory Visit (HOSPITAL_COMMUNITY)
Admission: RE | Admit: 2021-10-12 | Discharge: 2021-10-12 | Disposition: A | Discharge status: Home / Self Care | Payer: Medicare HMO | Source: Ambulatory Visit | Attending: Internal Medicine | Admitting: Internal Medicine

## 2021-10-12 VITALS — BP 122/72 | HR 58 | Ht 69.0 in | Wt 214.0 lb

## 2021-10-12 DIAGNOSIS — Z01818 Encounter for other preprocedural examination: Secondary | ICD-10-CM | POA: Insufficient documentation

## 2021-10-12 DIAGNOSIS — G4733 Obstructive sleep apnea (adult) (pediatric): Secondary | ICD-10-CM

## 2021-10-12 DIAGNOSIS — I495 Sick sinus syndrome: Secondary | ICD-10-CM | POA: Insufficient documentation

## 2021-10-12 LAB — BASIC METABOLIC PANEL
Anion gap: 5 (ref 5–15)
BUN: 18 mg/dL (ref 6–20)
CO2: 25 mmol/L (ref 22–32)
Calcium: 8.9 mg/dL (ref 8.9–10.3)
Chloride: 108 mmol/L (ref 98–111)
Creatinine, Ser: 1.01 mg/dL (ref 0.61–1.24)
GFR, Estimated: >60 mL/min (ref >60)
Glucose, Bld: 97 mg/dL (ref 70–99)
Potassium: 4.2 mmol/L (ref 3.5–5.1)
Sodium: 138 mmol/L (ref 135–145)

## 2021-10-12 LAB — CBC
HCT: 42.4 % (ref 39.0–52.0)
Hemoglobin: 14.3 g/dL (ref 13.0–17.0)
MCH: 32.2 pg (ref 26.0–34.0)
MCHC: 33.7 g/dL (ref 30.0–36.0)
MCV: 95.5 fL (ref 80.0–100.0)
Platelets: 250 10*3/uL (ref 150–400)
RBC: 4.44 MIL/uL (ref 4.22–5.81)
RDW: 13.1 % (ref 11.5–15.5)
WBC: 9.2 10*3/uL (ref 4.0–10.5)
nRBC: 0 % (ref 0.0–0.2)

## 2021-10-12 NOTE — Telephone Encounter (Signed)
Spoke with patients wife Marylu Lund  regarding HST results. They verbalized understanding. Patients wife states she does not feel like patient will be willing to try a CPAP machine. She agrees to discuss it further with Dr. Craige Cotta at their upcoming appointment on 11-22-21. Nothing further needed at this time.

## 2021-10-12 NOTE — Progress Notes (Signed)
HPI [name] [name] returns today for followup. He is a pleasant 61 yo man with sinus node dysfunction s/p PPM insertion. He also has autonomic dysfunction but has not passed out. His PPM has reached ERI. Hedenies chest pain or sob.  Allergies  Allergen Reactions   No Known Allergies      Current Outpatient Medications  Medication Sig Dispense Refill   aspirin EC 81 MG tablet Take 81 mg by mouth daily. Swallow whole.     ibuprofen (ADVIL) 800 MG tablet Take 800 mg by mouth every 8 (eight) hours as needed.     nitroGLYCERIN (NITROSTAT) 0.4 MG SL tablet Place 1 tablet (0.4 mg total) under the tongue every 5 (five) minutes x 3 doses as needed for chest pain (if no relief after 2nd dose, proceed to the ED or call 911). 25 tablet 2   rosuvastatin (CRESTOR) 10 MG tablet Take 10 mg by mouth at bedtime.     fludrocortisone (FLORINEF) 0.1 MG tablet Take 1 tablet (0.1 mg total) by mouth daily. (Patient not taking: Reported on 10/12/2021) 90 tablet 1   No current facility-administered medications for this visit.     Past Medical History:  Diagnosis Date   Anginal pain (HCC)    "EVERY SINGLE DAY" (05/04/2015)   Arthritis    "fingers; ankles" (05/04/2015)   COPD (chronic obstructive pulmonary disease) (HCC)    Daily headache    "hopefully this neck OR will help" (05/04/2015)   Depression    GERD (gastroesophageal reflux disease)    History of gout    History of hiatal hernia    History of shingles    Hypertension    has never been on meds   Neck pain    buldging disc   Presence of permanent cardiac pacemaker    Sick sinus syndrome (HCC)    Vasovagal syncope    last one Jan 2017    ROS:   All systems reviewed and negative except as noted in the HPI.   Past Surgical History:  Procedure Laterality Date   ANTERIOR CERVICAL DECOMP/DISCECTOMY FUSION  05/04/2015   ANTERIOR CERVICAL DECOMP/DISCECTOMY FUSION N/A 05/04/2015   Procedure: C6-7 Anterior Cervical Discectomy and Fusion,  Allograft, Plate ;  Surgeon: Eldred Manges, MD;  Location: [facility] OR;  Service: Orthopedics;  Laterality: N/A;   BACK SURGERY     CARDIAC CATHETERIZATION     2014   CARDIAC ELECTROPHYSIOLOGY MAPPING AND ABLATION     CARDIAC ELECTROPHYSIOLOGY STUDY & DFT     HERNIA REPAIR     LOOP RECORDER INSERTION  07/1999   [name] 07/05/2010   LOOP RECORDER REMOVAL  07/2001   [name] 07/05/2010   PACEMAKER IMPLANT  03/01/2011   Biotronik Evia implanted by Dr Luella Cook at [facility] for sick sinus syndrome   POSTERIOR CERVICAL FUSION/FORAMINOTOMY N/A 04/20/2016   Procedure: c6-7 posterior fusion, ILIAC BONE MARROW ASPIRATE, WIRING;  Surgeon: Eldred Manges, MD;  Location[facility];  Service: Orthopedics;  Laterality: N/A;   SHOULDER ARTHROSCOPY W/ ROTATOR CUFF REPAIR Right    UMBILICAL HERNIA REPAIR       Family History  Problem Relation Age of Onset   Diabetes Mother    Hypertension Mother    Hypertension Father    COPD Father    COPD Brother    Coronary artery disease Neg Hx      Social History   Socioeconomic History   Marital status: Married    Spouse name: Not  on file   Number of children: Not on file   Years of education: Not on file   Highest education level: Not on file  Occupational History   Occupation: Full time  Tobacco Use   Smoking status: Former    Packs/day: 0.12    Years: 2.00    Total pack years: 0.24    Types: Cigarettes   Smokeless tobacco: Never   Tobacco comments:    quit smoking in 1981  Vaping Use   Vaping Use: Never used  Substance and Sexual Activity   Alcohol use: Yes    Comment: occasional   Drug use: Not Currently    Frequency: 3.0 times per week    Types: Marijuana    Comment: 2-3 times per week    Sexual activity: Yes  Other Topics Concern   Not on file  Social History Narrative   Married   Social Determinants of Health   Financial Resource Strain: Not on file  Food Insecurity: Not on file  Transportation Needs: Not on file  Physical Activity: Not on  file  Stress: Not on file  Social Connections: Not on file  Intimate Partner Violence: Not on file     BP 122/72   Pulse (!) 58   Ht 5\' 9"  (1.753 m)   Wt 214 lb (97.1 kg)   SpO2 99%   BMI 31.60 kg/m   Physical Exam:  Well appearing middle aged man, NAD HEENT: Unremarkable Neck:  No JVD, no thyromegally Lymphatics:  No adenopathy Back:  No CVA tenderness Lungs:  Clear HEART:  Regular rate rhythm, no murmurs, no rubs, no clicks Abd:  soft, positive bowel sounds, no organomegally, no rebound, no guarding Ext:  2 plus pulses, no edema, no cyanosis, no clubbing Skin:  No rashes no nodules Neuro:  CN II through XII intact, motor grossly intact  EKG - nsr with atrial pacing  DEVICE  Normal device function.  See PaceArt for details.   Assess/Plan:  Sinus node dysfunction - he is asymptomatic s/p PPM insertion. He has reached ERI. We will schedule PM gen change out. Syncope - none recently. HTN -his bp is good today. PVC's - s/p remote ablation.  Alazne Quant,MD

## 2021-10-12 NOTE — Telephone Encounter (Signed)
HST 10/09/21 >> AHI 19.3, SpO2 low 82%   Please inform him that his sleep study shows moderate obstructive sleep apnea.  Please arrange for ROV with me or NP to discuss treatment options.

## 2021-10-12 NOTE — H&P (View-Only) (Signed)
    HPI [name] returns today for followup. He is a pleasant 61 yo man with sinus node dysfunction s/p PPM insertion. He also has autonomic dysfunction but has not passed out. His PPM has reached ERI. Hedenies chest pain or sob.  Allergies  Allergen Reactions   No Known Allergies      Current Outpatient Medications  Medication Sig Dispense Refill   aspirin EC 81 MG tablet Take 81 mg by mouth daily. Swallow whole.     ibuprofen (ADVIL) 800 MG tablet Take 800 mg by mouth every 8 (eight) hours as needed.     nitroGLYCERIN (NITROSTAT) 0.4 MG SL tablet Place 1 tablet (0.4 mg total) under the tongue every 5 (five) minutes x 3 doses as needed for chest pain (if no relief after 2nd dose, proceed to the ED or call 911). 25 tablet 2   rosuvastatin (CRESTOR) 10 MG tablet Take 10 mg by mouth at bedtime.     fludrocortisone (FLORINEF) 0.1 MG tablet Take 1 tablet (0.1 mg total) by mouth daily. (Patient not taking: Reported on 10/12/2021) 90 tablet 1   No current facility-administered medications for this visit.     Past Medical History:  Diagnosis Date   Anginal pain (HCC)    "EVERY SINGLE DAY" (05/04/2015)   Arthritis    "fingers; ankles" (05/04/2015)   COPD (chronic obstructive pulmonary disease) (HCC)    Daily headache    "hopefully this neck OR will help" (05/04/2015)   Depression    GERD (gastroesophageal reflux disease)    History of gout    History of hiatal hernia    History of shingles    Hypertension    has never been on meds   Neck pain    buldging disc   Presence of permanent cardiac pacemaker    Sick sinus syndrome (HCC)    Vasovagal syncope    last one Jan 2017    ROS:   All systems reviewed and negative except as noted in the HPI.   Past Surgical History:  Procedure Laterality Date   ANTERIOR CERVICAL DECOMP/DISCECTOMY FUSION  05/04/2015   ANTERIOR CERVICAL DECOMP/DISCECTOMY FUSION N/A 05/04/2015   Procedure: C6-7 Anterior Cervical Discectomy and Fusion,  Allograft, Plate ;  Surgeon: Mark C Yates, MD;  Location: [facility] OR;  Service: Orthopedics;  Laterality: N/A;   BACK SURGERY     CARDIAC CATHETERIZATION     2014   CARDIAC ELECTROPHYSIOLOGY MAPPING AND ABLATION     CARDIAC ELECTROPHYSIOLOGY STUDY & DFT     HERNIA REPAIR     LOOP RECORDER INSERTION  07/1999   /notes 07/05/2010   LOOP RECORDER REMOVAL  07/2001   /notes 07/05/2010   PACEMAKER IMPLANT  03/01/2011   Biotronik Evia implanted by Dr Tony Simmons at [facility] for sick sinus syndrome   POSTERIOR CERVICAL FUSION/FORAMINOTOMY N/A 04/20/2016   Procedure: c6-7 posterior fusion, ILIAC BONE MARROW ASPIRATE, WIRING;  Surgeon: Mark C Yates, MD;  Location: [facility] OR;  Service: Orthopedics;  Laterality: N/A;   SHOULDER ARTHROSCOPY W/ ROTATOR CUFF REPAIR Right    UMBILICAL HERNIA REPAIR       Family History  Problem Relation Age of Onset   Diabetes Mother    Hypertension Mother    Hypertension Father    COPD Father    COPD Brother    Coronary artery disease Neg Hx      Social History   Socioeconomic History   Marital status: Married    Spouse name: Not   on file   Number of children: Not on file   Years of education: Not on file   Highest education level: Not on file  Occupational History   Occupation: Full time  Tobacco Use   Smoking status: Former    Packs/day: 0.12    Years: 2.00    Total pack years: 0.24    Types: Cigarettes   Smokeless tobacco: Never   Tobacco comments:    quit smoking in 1981  Vaping Use   Vaping Use: Never used  Substance and Sexual Activity   Alcohol use: Yes    Comment: occasional   Drug use: Not Currently    Frequency: 3.0 times per week    Types: Marijuana    Comment: 2-3 times per week    Sexual activity: Yes  Other Topics Concern   Not on file  Social History Narrative   Married   Social Determinants of Health   Financial Resource Strain: Not on file  Food Insecurity: Not on file  Transportation Needs: Not on file  Physical Activity: Not on  file  Stress: Not on file  Social Connections: Not on file  Intimate Partner Violence: Not on file     BP 122/72   Pulse (!) 58   Ht 5\' 9"  (1.753 m)   Wt 214 lb (97.1 kg)   SpO2 99%   BMI 31.60 kg/m   Physical Exam:  Well appearing middle aged man, NAD HEENT: Unremarkable Neck:  No JVD, no thyromegally Lymphatics:  No adenopathy Back:  No CVA tenderness Lungs:  Clear HEART:  Regular rate rhythm, no murmurs, no rubs, no clicks Abd:  soft, positive bowel sounds, no organomegally, no rebound, no guarding Ext:  2 plus pulses, no edema, no cyanosis, no clubbing Skin:  No rashes no nodules Neuro:  CN II through XII intact, motor grossly intact  EKG - nsr with atrial pacing  DEVICE  Normal device function.  See PaceArt for details.   Assess/Plan:  Sinus node dysfunction - he is asymptomatic s/p PPM insertion. He has reached ERI. We will schedule PM gen change out. Syncope - none recently. HTN -his bp is good today. PVC's - s/p remote ablation.  Maraki Macquarrie,MD

## 2021-10-12 NOTE — Addendum Note (Signed)
Addended by: Kerney Elbe on: 10/12/2021 09:33 AM   Modules accepted: Orders

## 2021-10-12 NOTE — Patient Instructions (Signed)
Medication Instructions:  Your physician recommends that you continue on your current medications as directed. Please refer to the Current Medication list given to you today.  *If you need a refill on your cardiac medications before your next appointment, please call your pharmacy*   Lab Work: Your physician recommends that you return for lab work in: BMET, CBC  If you have labs (blood work) drawn today and your tests are completely normal, you will receive your results only by: MyChart Message (if you have MyChart) OR A paper copy in the mail If you have any lab test that is abnormal or we need to change your treatment, we will call you to review the results.   Testing/Procedures: Your physician has recommended that you have a pacemaker inserted. A pacemaker is a small device that is placed under the skin of your chest or abdomen to help control abnormal heart rhythms. This device uses electrical pulses to prompt the heart to beat at a normal rate. Pacemakers are used to treat heart rhythms that are too slow. Wire (leads) are attached to the pacemaker that goes into the chambers of you heart. This is done in the hospital and usually requires and overnight stay. Please see the instruction sheet given to you today for more information.    Follow-Up: At [facility], you and your health needs are our priority.  As part of our continuing mission to provide you with exceptional heart care, we have created designated Provider Care Teams.  These Care Teams include your primary Cardiologist (physician) and Advanced Practice Providers (APPs -  Physician Assistants and Nurse Practitioners) who all work together to provide you with the care you need, when you need it.  We recommend signing up for the patient portal called "MyChart".  Sign up information is provided on this After Visit Summary.  MyChart is used to connect with patients for Virtual Visits (Telemedicine).  Patients are able to view  lab/test results, encounter notes, upcoming appointments, etc.  Non-urgent messages can be sent to your provider as well.   To learn more about what you can do with MyChart, go to [email or web address].    Your next appointment:   3 month(s)  The format for your next appointment:   In Person  Provider:   Lewayne Bunting, MD    Other Instructions Thank you for choosing [facility]!     Implantable Device Instructions    [name] [name]  10/12/2021  You are scheduled for a PPM generator change on Monday, September 11 with Dr. Lewayne Bunting.  1. Pre procedure Lab testing:  [facility]     2. Please arrive at the Main Entrance A at [facility]: [address] [place], [place] [address] on September 11 at 8:30 AM (This time is two hours before your procedure to ensure your preparation). Free valet parking service is available. You will check in at ADMITTING. The support person will be asked to wait in the waiting room.  It is OK to have someone drop you off and come back when you are ready to be discharged.        Special note: Every effort is made to have your procedure done on time. Please understand that emergencies sometimes delay  scheduled procedures.  3.  No eating or drinking after midnight prior to procedure.     4.  Medication instructions:  On the morning of your procedure You may take your medicine with a sip of  water.    5.  The night before your procedure and the morning of your procedure scrub your neck/chest with CHG surgical scrub.  See instruction letter.  6. Plan to go home the same day, you will only stay overnight if medically necessary. 7.  You MUST have a responsible adult to drive you home. 8.   An adult MUST be with you the first 24 hours after you arrive home. 9..  Bring a current list of your medications, and the last time and date medication taken. 10. Bring ID and current insurance cards. 11. .Please wear clothes that are  easy to get on and off and wear slip-on shoes.    You will follow up with the [facility] 10-14 days after your procedure.  You will follow up with Dr. Cristopher Peru 91 days after your procedure.  These appointments will be made for you.   * If you have ANY questions after you get home, please call the office at [phone] or send a MyChart message.  FYI: For your safety, and to allow [place] to monitor your vital signs accurately during the surgery/procedure we request that if you have artificial nails, gel coating, SNS etc. Please have those removed prior to your surgery/procedure. Not having the nail coverings /polish removed may result in cancellation or delay of your surgery/procedure.    [place] - Preparing For Surgery    Before surgery, you can play an important role. Because skin is not sterile, your skin needs to be as free of germs as possible. You can reduce the number of germs on your skin by washing with CHG (chlorahexidine gluconate) Soap before surgery.  CHG is an antiseptic cleaner which kills germs and bonds with the skin to continue killing germs even after washing.  Please do not use if you have an allergy to CHG or antibacterial soaps.  If your skin becomes reddened/irritated stop using the CHG.   Do not shave (including legs and underarms) for at least 48 hours prior to first CHG shower.  It is OK to shave your face.  Please follow these instructions carefully:  1.  Shower the night before surgery and the morning of surgery with CHG.  2.  If you choose to wash your hair, wash your hair first as usual with your normal shampoo.  3.  After you shampoo, rinse your hair and body thoroughly to remove the shampoo.  4.  Use CHG as you would any other liquid soap.  You can apply CHG directly to the skin and wash gently with a clean washcloth. 5.  Apply the CHG Soap to your body ONLY FROM THE NECK DOWN.  Do not use on open wounds or open sores.  Avoid contact with your  eyes, ears, mouth and genitals (private parts).  Wash genitals (private parts) with your normal soap.  6.  Wash thoroughly, paying special attention to the area where your surgery will be performed.  7.  Thoroughly rinse your body with warm water from the neck down.   8.  DO NOT shower/wash with your normal soap after using and rinsing off the CHG soap.  9.  Pat yourself dry with a clean towel.           10.  Wear clean pajamas.           11.  Place clean sheets on your bed the night of your first shower and do not sleep with pets.  Day of  Surgery: Do not apply any deodorants/lotions.  Please wear clean clothes to the hospital/surgery center.   Important Information About Sugar

## 2021-10-27 ENCOUNTER — Telehealth: Payer: Self-pay | Admitting: Internal Medicine

## 2021-10-27 NOTE — Telephone Encounter (Signed)
Left message for patient's spouse Marylu Lund. Explained procedure instructions were printed in AVS that should have been given to patient at last office visit with Dr. Ladona Ridgel on 10/12/21.  Reviewed instructions as printed.  Provided office number for callback if any questions.

## 2021-10-27 NOTE — Telephone Encounter (Signed)
Patient's wife calling to find out if there are any restrictions prior to the patient's procedure like eating.

## 2021-10-27 NOTE — Pre-Procedure Instructions (Signed)
Instructed patient on the following items: Arrival time 0800 Nothing to eat or drink after midnight No meds AM of procedure Responsible person to drive you home and stay with you for 24 hrs Wash with special soap night before and morning of procedure  

## 2021-10-30 ENCOUNTER — Ambulatory Visit (HOSPITAL_COMMUNITY)
Admission: RE | Admit: 2021-10-30 | Discharge: 2021-10-30 | Disposition: A | Discharge status: Home / Self Care | Payer: Medicare HMO | Attending: Internal Medicine | Admitting: Internal Medicine

## 2021-10-30 ENCOUNTER — Other Ambulatory Visit: Payer: Self-pay

## 2021-10-30 ENCOUNTER — Encounter (HOSPITAL_COMMUNITY)
Admission: RE | Disposition: A | Discharge status: Home / Self Care | Payer: Self-pay | Source: Home / Self Care | Attending: Internal Medicine

## 2021-10-30 DIAGNOSIS — I10 Essential (primary) hypertension: Secondary | ICD-10-CM | POA: Insufficient documentation

## 2021-10-30 DIAGNOSIS — Z45010 Encounter for checking and testing of cardiac pacemaker pulse generator [battery]: Secondary | ICD-10-CM | POA: Diagnosis present

## 2021-10-30 DIAGNOSIS — Z87891 Personal history of nicotine dependence: Secondary | ICD-10-CM | POA: Diagnosis not present

## 2021-10-30 DIAGNOSIS — I495 Sick sinus syndrome: Secondary | ICD-10-CM | POA: Insufficient documentation

## 2021-10-30 HISTORY — PX: PPM GENERATOR CHANGEOUT: EP1233

## 2021-10-30 SURGERY — PPM GENERATOR CHANGEOUT

## 2021-10-30 MED ORDER — LIDOCAINE HCL (PF) 1 % IJ SOLN
INTRAMUSCULAR | Status: AC
  Filled 2021-10-30: qty 60

## 2021-10-30 MED ORDER — ACETAMINOPHEN 325 MG PO TABS: 325.0000 mg | ORAL_TABLET | ORAL | Status: DC | PRN

## 2021-10-30 MED ORDER — SODIUM CHLORIDE 0.9 % IV SOLN
80.0000 mg | INTRAVENOUS | Status: AC
  Administered 2021-10-30: 80 mg

## 2021-10-30 MED ORDER — POVIDONE-IODINE 10 % EX SWAB
2.0000 | Freq: Once | CUTANEOUS | Status: AC
  Administered 2021-10-30: 2 via TOPICAL

## 2021-10-30 MED ORDER — CHLORHEXIDINE GLUCONATE 4 % EX LIQD
4.0000 | Freq: Once | CUTANEOUS | Status: DC
  Filled 2021-10-30: qty 60

## 2021-10-30 MED ORDER — CEFAZOLIN SODIUM-DEXTROSE 2-4 GM/100ML-% IV SOLN
INTRAVENOUS | Status: AC
  Filled 2021-10-30: qty 100

## 2021-10-30 MED ORDER — LIDOCAINE HCL (PF) 1 % IJ SOLN
INTRAMUSCULAR | Status: DC | PRN
  Administered 2021-10-30: 60 mL

## 2021-10-30 MED ORDER — SODIUM CHLORIDE 0.9 % IV SOLN
INTRAVENOUS | Status: AC
  Filled 2021-10-30: qty 2

## 2021-10-30 MED ORDER — ONDANSETRON HCL 4 MG/2ML IJ SOLN: 4.0000 mg | Freq: Four times a day (QID) | INTRAMUSCULAR | Status: DC | PRN

## 2021-10-30 MED ORDER — SODIUM CHLORIDE 0.9 % IV SOLN: INTRAVENOUS | Status: DC

## 2021-10-30 MED ORDER — CEFAZOLIN SODIUM-DEXTROSE 2-4 GM/100ML-% IV SOLN
2.0000 g | INTRAVENOUS | Status: AC
  Administered 2021-10-30: 2 g via INTRAVENOUS

## 2021-10-30 SURGICAL SUPPLY — 4 items
CABLE SURGICAL S-101-97-12 (CABLE) ×2 IMPLANT
PACEMAKER EDORA 8DR-T MRI (Pacemaker) IMPLANT
PAD DEFIB RADIO PHYSIO CONN (PAD) ×2 IMPLANT
TRAY PACEMAKER INSERTION (PACKS) ×2 IMPLANT

## 2021-10-30 NOTE — Discharge Instructions (Signed)
Implantable Cardiac Device Battery Change, Care After  This sheet gives you information about how to care for yourself after your procedure. Your health care provider may also give you more specific instructions. If you have problems or questions, contact your health care provider. What can I expect after the procedure? After your procedure, it is common to have: Pain or soreness at the site where the cardiac device was inserted. Swelling at the site where the cardiac device was inserted. You should received an information card for your new device in 4-8 weeks. Follow these instructions at home: Incision care  Keep the incision clean and dry. Do not take baths, swim, or use a hot tub until after your wound check.  Do not shower for at least 7 days, or as directed by your health care provider. Pat the area dry with a clean towel. Do not rub the area. This may cause bleeding. Follow instructions from your health care provider about how to take care of your incision. Make sure you: Leave stitches (sutures), skin glue, or adhesive strips in place. These skin closures may need to stay in place for 2 weeks or longer. If adhesive strip edges start to loosen and curl up, you may trim the loose edges. Do not remove adhesive strips completely unless your health care provider tells you to do that. Check your incision area every day for signs of infection. Check for: More redness, swelling, or pain. More fluid or blood. Warmth. Pus or a bad smell. Activity Do not lift anything that is heavier than 10 lb (4.5 kg) 3-5 days. For the first week, or as long as told by your health care provider: Avoid lifting your affected arm higher than your shoulder. After 1 week, Be gentle when you move your arms over your head. It is okay to raise your arm to comb your hair. Avoid strenuous exercise. Ask your health care provider when it is okay to: Resume your normal activities. Return to work or school. Resume  sexual activity. Eating and drinking Eat a heart-healthy diet. This should include plenty of fresh fruits and vegetables, whole grains, low-fat dairy products, and lean protein like chicken and fish. Limit alcohol intake to no more than 1 drink a day for non-pregnant women and 2 drinks a day for men. One drink equals 12 oz of beer, 5 oz of wine, or 1 oz of hard liquor. Check ingredients and nutrition facts on packaged foods and beverages. Avoid the following types of food: Food that is high in salt (sodium). Food that is high in saturated fat, like full-fat dairy or red meat. Food that is high in trans fat, like fried food. Food and drinks that are high in sugar. Lifestyle Do not use any products that contain nicotine or tobacco, such as cigarettes and e-cigarettes. If you need help quitting, ask your health care provider. Take steps to manage and control your weight. Once cleared, get regular exercise. Aim for 150 minutes of moderate-intensity exercise (such as walking or yoga) or 75 minutes of vigorous exercise (such as running or swimming) each week. Manage other health problems, such as diabetes or high blood pressure. Ask your health care provider how you can manage these conditions. General instructions Do not drive for 24 hours after your procedure if you were given a medicine to help you relax (sedative). Take over-the-counter and prescription medicines only as told by your health care provider. Avoid putting pressure on the area where the cardiac device was placed. If  you need an MRI after your cardiac device has been placed, be sure to tell the health care provider who orders the MRI that you have a cardiac device. Avoid close and prolonged exposure to electrical devices that have strong magnetic fields. These include: Cell phones. Avoid keeping them in a pocket near the cardiac device, and try using the ear opposite the cardiac device. MP3 players. Household appliances, like  microwaves. Metal detectors. Electric generators. High-tension wires. Keep all follow-up visits as directed by your health care provider. This is important. Contact a health care provider if: You have pain at the incision site that is not relieved by over-the-counter or prescription medicines. You have any of these around your incision site or coming from it: More redness, swelling, or pain. Fluid or blood. Warmth to the touch. Pus or a bad smell. You have a fever. You feel brief, occasional palpitations, light-headedness, or any symptoms that you think might be related to your heart. Get help right away if: You experience chest pain that is different from the pain at the cardiac device site. You develop a red streak that extends above or below the incision site. You experience shortness of breath. You have palpitations or an irregular heartbeat. You have light-headedness that does not go away quickly. You faint or have dizzy spells. Your pulse suddenly drops or increases rapidly and does not return to normal. You begin to gain weight and your legs and ankles swell. Summary After your procedure, it is common to have pain, soreness, and some swelling where the cardiac device was inserted. Make sure to keep your incision clean and dry. Follow instructions from your health care provider about how to take care of your incision. Check your incision every day for signs of infection, such as more pain or swelling, pus or a bad smell, warmth, or leaking fluid and blood. Avoid strenuous exercise and lifting your left arm higher than your shoulder for 2 weeks, or as long as told by your health care provider. This information is not intended to replace advice given to you by your health care provider. Make sure you discuss any questions you have with your health care provider.

## 2021-10-30 NOTE — Interval H&P Note (Signed)
History and Physical Interval Note:  10/30/2021 10:01 AM  [name] [name]  has presented today for surgery, with the diagnosis of ERI.  The various methods of treatment have been discussed with the patient and family. After consideration of risks, benefits and other options for treatment, the patient has consented to  Procedure(s): PPM GENERATOR CHANGEOUT (N/A) as a surgical intervention.  The patient's history has been reviewed, patient examined, no change in status, stable for surgery.  I have reviewed the patient's chart and labs.  Questions were answered to the patient's satisfaction.     Lewayne Bunting

## 2021-10-31 ENCOUNTER — Telehealth: Payer: Self-pay | Admitting: Internal Medicine

## 2021-10-31 ENCOUNTER — Encounter (HOSPITAL_COMMUNITY): Payer: Self-pay | Admitting: Internal Medicine

## 2021-10-31 NOTE — Telephone Encounter (Signed)
Returned phone call.  Wife states patients outer dressing (4x4 with clear tegarderm) has blood noted on the white 4x4. Advised that is normal and will need to be removed 24 hours after the procedure was completed. Denies active bleeding or blood increasing in size on the dressing. Also advised there are steri-strips below that may have blood on them which is completely normal. Advised if the blood is coming from around the steri-strips to hold pressure and call the device clinic. Also advised steri-strips need to stay dry from any water, do not shower or bath. Voiced understanding. Advised if any further questions or concerns arise to please call the device clinic. Agreeable to plan.

## 2021-10-31 NOTE — Telephone Encounter (Signed)
Wife called stating patient has bleed through his bandage from procedure yesterday and wife would like to know next steps.

## 2021-11-10 ENCOUNTER — Telehealth: Payer: Self-pay | Admitting: Internal Medicine

## 2021-11-10 NOTE — Telephone Encounter (Signed)
Wife states she was returning call. Please advise  

## 2021-11-10 NOTE — Telephone Encounter (Signed)
No documentation stating reason for phone call .Only reason is maybe reminder for appt for wound check next week ./cy

## 2021-11-13 NOTE — Telephone Encounter (Signed)
This was ordered to be done by Dr Llana Aliment.

## 2021-11-15 ENCOUNTER — Ambulatory Visit: Payer: Medicare HMO | Attending: Internal Medicine

## 2021-11-15 DIAGNOSIS — R55 Syncope and collapse: Secondary | ICD-10-CM | POA: Diagnosis not present

## 2021-11-15 LAB — CUP PACEART INCLINIC DEVICE CHECK
Date Time Interrogation Session: 20230927162907
Implantable Lead Implant Date: 20130110
Implantable Lead Implant Date: 20130110
Implantable Lead Location: 753859
Implantable Lead Location: 753860
Implantable Lead Model: 350
Implantable Lead Model: 350
Implantable Pulse Generator Implant Date: 20230911
Pulse Gen Model: 407145
Pulse Gen Serial Number: 100044202

## 2021-11-15 NOTE — Patient Instructions (Signed)

## 2021-11-15 NOTE — Progress Notes (Signed)
Wound check appointment. Steri-strips removed. Wound without redness or edema. Incision edges approximated, wound well healed. Normal device function. Thresholds, sensing, and impedances consistent with implant measurements. Device programmed at 3.5V for extra safety margin until 3 month visit. Histogram distribution appropriate for patient and level of activity. 20 atrial events logged, appear RA noise which is not a new finding. No ventricular arrhythmias noted. Patient educated about wound care, arm mobility, lifting restrictions, shock plan. ROV in 3 months with implanting physician.

## 2021-11-22 ENCOUNTER — Ambulatory Visit (INDEPENDENT_AMBULATORY_CARE_PROVIDER_SITE_OTHER): Payer: Medicare HMO | Admitting: Pulmonary Disease

## 2021-11-22 ENCOUNTER — Encounter: Payer: Self-pay | Admitting: Pulmonary Disease

## 2021-11-22 VITALS — BP 132/78 | HR 78 | Temp 97.6°F | Ht 69.0 in | Wt 213.6 lb

## 2021-11-22 DIAGNOSIS — G4733 Obstructive sleep apnea (adult) (pediatric): Secondary | ICD-10-CM

## 2021-11-22 DIAGNOSIS — Z7189 Other specified counseling: Secondary | ICD-10-CM
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
## 2021-11-22 NOTE — Patient Instructions (Signed)
Will arrange for auto CPAP set up  Follow up in 3 to 4 months 

## 2021-11-27 ENCOUNTER — Telehealth: Payer: Self-pay | Admitting: Pulmonary Disease

## 2021-11-27 NOTE — Telephone Encounter (Signed)
Mariann Laster states patient's insurance is out of network for CPAP machine. Mariann Laster phone number is [phone].   PCCs please advise.

## 2021-11-27 NOTE — Telephone Encounter (Signed)
Looks like another one for [facility]

## 2021-12-20 NOTE — Telephone Encounter (Signed)
Checked patient's chart. Cpap order was sent to [facility]. Will close this encounter.

## 2022-01-04 ENCOUNTER — Encounter: Payer: Self-pay | Admitting: Orthopaedic Surgery

## 2022-01-04 ENCOUNTER — Ambulatory Visit: Payer: Self-pay

## 2022-01-04 ENCOUNTER — Ambulatory Visit (INDEPENDENT_AMBULATORY_CARE_PROVIDER_SITE_OTHER): Payer: Medicare HMO | Admitting: Orthopaedic Surgery

## 2022-01-04 VITALS — Ht 69.0 in | Wt 215.0 lb

## 2022-01-04 DIAGNOSIS — M25511 Pain in right shoulder: Secondary | ICD-10-CM

## 2022-01-04 DIAGNOSIS — M7551 Bursitis of right shoulder: Secondary | ICD-10-CM | POA: Diagnosis not present

## 2022-01-04 DIAGNOSIS — G8929 Other chronic pain: Secondary | ICD-10-CM | POA: Diagnosis not present

## 2022-01-04 NOTE — Progress Notes (Signed)
Office Visit Note   Patient: [name] [name]           Date of Birth: July 27, 1960           MRN: [id number] Visit Date: 01/04/2022              Requested by: Jason Coop, FNP [facility] [address] [place],  [place] [address] PCP: Jason Coop, FNP   Assessment & Plan: Visit Diagnoses:  1. Chronic right shoulder pain     Plan: Right shoulder injection performed for posttraumatic bursitis after fall with impingement.  He had good relief with the injection he can call if he is having ongoing problems we can consider diagnostic imaging.  By exam today he has a negative drop arm test and appears he has some bursitis in the shoulder after the fall.  Follow-Up Instructions: No follow-ups on file.   Orders:  Orders Placed This Encounter  Procedures   XR Shoulder Right   No orders of the defined types were placed in this encounter.     Procedures: Large Joint Inj: R subacromial bursa on 01/04/2022 4:11 PM Indications: pain Details: 22 G 1.5 in needle  Arthrogram: No  Medications: 4 mL bupivacaine 0.25 %; 40 mg methylPREDNISolone acetate 40 MG/ML; 0.5 mL lidocaine 1 % Outcome: tolerated well, no immediate complications Procedure, treatment alternatives, risks and benefits explained, specific risks discussed. Consent was given by the patient. Immediately prior to procedure a time out was called to verify the correct patient, procedure, equipment, support staff and site/side marked as required. Patient was prepped and draped in the usual sterile fashion.       Clinical Data: No additional findings.   Subjective: Chief Complaint  Patient presents with   Right Shoulder - Pain    HPI 61 year old male said previous anterior and then posterior cervical fusion is seen with right shoulder pain.  He states he slipped falling backwards put his arm out as he went down and then had increased shoulder pain discomfort with  overhead reaching.  He is taken some oxycodone and a muscle relaxant he had leftover without improvement.  He has had previous shoulder arthroscopy with subacromial decompression on the right shoulder many years ago.  Patient denies any numbness or tingling in his hands post fall.  Review of Systems all systems noncontributory HPI.   Objective: Vital Signs: Ht 5\' 9"  (1.753 m)   Wt 215 lb (97.5 kg)   BMI 31.75 kg/m   Physical Exam Constitutional:      Appearance: He is well-developed.  HENT:     Head: Normocephalic and atraumatic.     Right Ear: External ear normal.     Left Ear: External ear normal.  Eyes:     Pupils: Pupils are equal, round, and reactive to light.  Neck:     Thyroid: No thyromegaly.     Trachea: No tracheal deviation.  Cardiovascular:     Rate and Rhythm: Normal rate.  Pulmonary:     Effort: Pulmonary effort is normal.     Breath sounds: No wheezing.  Abdominal:     General: Bowel sounds are normal.     Palpations: Abdomen is soft.  Musculoskeletal:     Cervical back: Neck supple.  Skin:    General: Skin is warm and dry.     Capillary Refill: Capillary refill takes less than 2 seconds.  Neurological:     Mental Status:  He is alert and oriented to person, place, and time.  Psychiatric:        Behavior: Behavior normal.        Thought Content: Thought content normal.        Judgment: Judgment normal.     Ortho Exam cervical incisions healed anterior and posteriorly.  No brachial plexus tenderness.  Positive impingement right shoulder.  Pain reproduced with resisted supraspinatus testing long head of the biceps minimally tender.  Upper extremity reflexes are symmetrical no lower extremity clonus normal heel-toe gait.  No impingement left shoulder.  Specialty Comments:  No specialty comments available.  Imaging: No results found.   PMFS History: Patient Active Problem List   Diagnosis Date Noted   Cervical pseudoarthrosis (HCC) 04/20/2016    Spondylosis, cervical 05/05/2015   S/P cervical spinal fusion 05/04/2015   NONSPECIFIC ABNORMAL UNSPEC CV FUNCTION STUDY 04/06/2009   CHEST PAIN UNSPECIFIED 01/20/2009   Syncope and collapse 01/12/2009   Past Medical History:  Diagnosis Date   Anginal pain (HCC)    "EVERY SINGLE DAY" (05/04/2015)   Arthritis    "fingers; ankles" (05/04/2015)   COPD (chronic obstructive pulmonary disease) (HCC)    Daily headache    "hopefully this neck OR will help" (05/04/2015)   Depression    GERD (gastroesophageal reflux disease)    History of gout    History of hiatal hernia    History of shingles    Hypertension    has never been on meds   Neck pain    buldging disc   Presence of permanent cardiac pacemaker    Sick sinus syndrome (HCC)    Vasovagal syncope    last one Jan 2017    Family History  Problem Relation Age of Onset   Diabetes Mother    Hypertension Mother    Hypertension Father    COPD Father    COPD Brother    Coronary artery disease Neg Hx     Past Surgical History:  Procedure Laterality Date   ANTERIOR CERVICAL DECOMP/DISCECTOMY FUSION  05/04/2015   ANTERIOR CERVICAL DECOMP/DISCECTOMY FUSION N/A 05/04/2015   Procedure: C6-7 Anterior Cervical Discectomy and Fusion, Allograft, Plate ;  Surgeon: Eldred Manges, MD;  Location: [facility] OR;  Service: Orthopedics;  Laterality: N/A;   BACK SURGERY     CARDIAC CATHETERIZATION     2014   CARDIAC ELECTROPHYSIOLOGY MAPPING AND ABLATION     CARDIAC ELECTROPHYSIOLOGY STUDY & DFT     HERNIA REPAIR     LOOP RECORDER INSERTION  07/1999   [name] 07/05/2010   LOOP RECORDER REMOVAL  07/2001   [name] 07/05/2010   PACEMAKER IMPLANT  03/01/2011   Biotronik Evia implanted by Dr Luella Cook at [facility] for sick sinus syndrome   POSTERIOR CERVICAL FUSION/FORAMINOTOMY N/A 04/20/2016   Procedure: c6-7 posterior fusion, ILIAC BONE MARROW ASPIRATE, WIRING;  Surgeon: Eldred Manges, MD;  Location: [facility] OR;  Service: Orthopedics;  Laterality: N/A;   PPM GENERATOR  CHANGEOUT N/A 10/30/2021   Procedure: PPM GENERATOR CHANGEOUT;  Surgeon: Marinus Maw, MD;  Location: [facility] INVASIVE CV LAB;  Service: Cardiovascular;  Laterality: N/A;   SHOULDER ARTHROSCOPY W/ ROTATOR CUFF REPAIR Right    UMBILICAL HERNIA REPAIR     Social History   Occupational History   Occupation: Full time  Tobacco Use   Smoking status: Former    Packs/day: 0.12    Years: 2.00    Total pack years: 0.24    Types: Cigarettes  Smokeless tobacco: Never   Tobacco comments:    quit smoking in 1981  Vaping Use   Vaping Use: Never used  Substance and Sexual Activity   Alcohol use: Yes    Comment: occasional   Drug use: Not Currently    Frequency: 3.0 times per week    Types: Marijuana    Comment: 2-3 times per week    Sexual activity: Yes

## 2022-01-05 MED ORDER — METHYLPREDNISOLONE ACETATE 40 MG/ML IJ SUSP
40.0000 mg | INTRAMUSCULAR | Status: AC | PRN
  Administered 2022-01-04: 40 mg via INTRA_ARTICULAR

## 2022-01-05 MED ORDER — LIDOCAINE HCL 1 % IJ SOLN
0.5000 mL | INTRAMUSCULAR | Status: AC | PRN
  Administered 2022-01-04: .5 mL

## 2022-01-05 MED ORDER — BUPIVACAINE HCL 0.25 % IJ SOLN
4.0000 mL | INTRAMUSCULAR | Status: AC | PRN
  Administered 2022-01-04: 4 mL via INTRA_ARTICULAR

## 2022-01-16 ENCOUNTER — Ambulatory Visit: Payer: Medicare HMO | Attending: Internal Medicine | Admitting: Internal Medicine

## 2022-01-16 ENCOUNTER — Encounter: Payer: Self-pay | Admitting: Internal Medicine

## 2022-01-16 VITALS — BP 130/78 | HR 71 | Ht 69.0 in | Wt 222.0 lb

## 2022-01-16 DIAGNOSIS — I10 Essential (primary) hypertension: Secondary | ICD-10-CM

## 2022-01-16 NOTE — Patient Instructions (Signed)
Medication Instructions:  Your physician recommends that you continue on your current medications as directed. Please refer to the Current Medication list given to you today.  *If you need a refill on your cardiac medications before your next appointment, please call your pharmacy*   Lab Work: NONE   If you have labs (blood work) drawn today and your tests are completely normal, you will receive your results only by: MyChart Message (if you have MyChart) OR A paper copy in the mail If you have any lab test that is abnormal or we need to change your treatment, we will call you to review the results.   Testing/Procedures: NONE    Follow-Up: At [facility], you and your health needs are our priority.  As part of our continuing mission to provide you with exceptional heart care, we have created designated Provider Care Teams.  These Care Teams include your primary Cardiologist (physician) and Advanced Practice Providers (APPs -  Physician Assistants and Nurse Practitioners) who all work together to provide you with the care you need, when you need it.  We recommend signing up for the patient portal called "MyChart".  Sign up information is provided on this After Visit Summary.  MyChart is used to connect with patients for Virtual Visits (Telemedicine).  Patients are able to view lab/test results, encounter notes, upcoming appointments, etc.  Non-urgent messages can be sent to your provider as well.   To learn more about what you can do with MyChart, go to [email or web address].    Your next appointment:   1 year(s)  The format for your next appointment:   In Person  Provider:   Gregg Taylor, MD    Other Instructions Thank you for choosing [facility]!    Important Information About Sugar       

## 2022-01-16 NOTE — Progress Notes (Signed)
HPI Mr. Yepes returns today for followup. He is a pleasant 61 yo man with sinus node dysfunction s/p PPM insertion. He also has autonomic dysfunction but has not passed out. His PPM was changed out 3 months ago. He denies sob. He has non-cardiac chest pain which is non-exertional.  Allergies  Allergen Reactions   No Known Allergies      Current Outpatient Medications  Medication Sig Dispense Refill   aspirin EC 81 MG tablet Take 81 mg by mouth daily. Swallow whole.     fludrocortisone (FLORINEF) 0.1 MG tablet Take 0.1 mg by mouth daily as needed (to build up blood).     HYDROcodone-acetaminophen (NORCO/VICODIN) 5-325 MG tablet Take 1 tablet by mouth every 8 (eight) hours as needed.     ibuprofen (ADVIL) 800 MG tablet Take 800 mg by mouth daily.     methocarbamol (ROBAXIN) 750 MG tablet Take 750 mg by mouth every 4 (four) hours.     nitroGLYCERIN (NITROSTAT) 0.4 MG SL tablet Place 1 tablet (0.4 mg total) under the tongue every 5 (five) minutes x 3 doses as needed for chest pain (if no relief after 2nd dose, proceed to the ED or call 911). 25 tablet 2   rosuvastatin (CRESTOR) 10 MG tablet Take 10 mg by mouth daily.     No current facility-administered medications for this visit.     Past Medical History:  Diagnosis Date   Anginal pain (HCC)    "EVERY SINGLE DAY" (05/04/2015)   Arthritis    "fingers; ankles" (05/04/2015)   COPD (chronic obstructive pulmonary disease) (HCC)    Daily headache    "hopefully this neck OR will help" (05/04/2015)   Depression    GERD (gastroesophageal reflux disease)    History of gout    History of hiatal hernia    History of shingles    Hypertension    has never been on meds   Neck pain    buldging disc   Presence of permanent cardiac pacemaker    Sick sinus syndrome (HCC)    Vasovagal syncope    last one Jan 2017    ROS:   All systems reviewed and negative except as noted in the HPI.   Past Surgical History:  Procedure Laterality  Date   ANTERIOR CERVICAL DECOMP/DISCECTOMY FUSION  05/04/2015   ANTERIOR CERVICAL DECOMP/DISCECTOMY FUSION N/A 05/04/2015   Procedure: C6-7 Anterior Cervical Discectomy and Fusion, Allograft, Plate ;  Surgeon: Eldred Manges, MD;  Location[facility];  Service: Orthopedics;  Laterality: N/A;   BACK SURGERY     CARDIAC CATHETERIZATION     2014   CARDIAC ELECTROPHYSIOLOGY MAPPING AND ABLATION     CARDIAC ELECTROPHYSIOLOGY STUDY & DFT     HERNIA REPAIR     LOOP RECORDER INSERTION  07/1999   [name] 07/05/2010   LOOP RECORDER REMOVAL  07/2001   [name] 07/05/2010   PACEMAKER IMPLANT  03/01/2011   Biotronik Evia implanted by Dr Luella Cook at [facility] for sick sinus syndrome   POSTERIOR CERVICAL FUSION/FORAMINOTOMY N/A 04/20/2016   Procedure: c6-7 posterior fusion, ILIAC BONE MARROW ASPIRATE, WIRING;  Surgeon: Eldred Manges, MD;  Location: [facility] OR;  Service: Orthopedics;  Laterality: N/A;   PPM GENERATOR CHANGEOUT N/A 10/30/2021   Procedure: PPM GENERATOR CHANGEOUT;  Surgeon: Marinus Maw, MD;  Location: [facility] INVASIVE CV LAB;  Service: Cardiovascular;  Laterality: N/A;   SHOULDER ARTHROSCOPY W/ ROTATOR CUFF REPAIR Right    UMBILICAL HERNIA REPAIR  Family History  Problem Relation Age of Onset   Diabetes Mother    Hypertension Mother    Hypertension Father    COPD Father    COPD Brother    Coronary artery disease Neg Hx      Social History   Socioeconomic History   Marital status: Married    Spouse name: Not on file   Number of children: Not on file   Years of education: Not on file   Highest education level: Not on file  Occupational History   Occupation: Full time  Tobacco Use   Smoking status: Former    Packs/day: 0.12    Years: 2.00    Total pack years: 0.24    Types: Cigarettes   Smokeless tobacco: Never   Tobacco comments:    quit smoking in 1981  Vaping Use   Vaping Use: Never used  Substance and Sexual Activity   Alcohol use: Yes    Comment: occasional   Drug use: Not  Currently    Frequency: 3.0 times per week    Types: Marijuana    Comment: 2-3 times per week    Sexual activity: Yes  Other Topics Concern   Not on file  Social History Narrative   Married   Social Determinants of Health   Financial Resource Strain: Not on file  Food Insecurity: Not on file  Transportation Needs: Not on file  Physical Activity: Not on file  Stress: Not on file  Social Connections: Not on file  Intimate Partner Violence: Not on file     BP 130/78 (BP Location: Left Arm, Patient Position: Sitting, Cuff Size: Large)   Pulse 71   Ht 5\' 9"  (1.753 m)   Wt 222 lb (100.7 kg)   BMI 32.78 kg/m   Physical Exam:  Well appearing NAD HEENT: Unremarkable Neck:  No JVD, no thyromegally Lymphatics:  No adenopathy Back:  No CVA tenderness Lungs:  Clear HEART:  Regular rate rhythm, no murmurs, no rubs, no clicks Abd:  soft, positive bowel sounds, no organomegally, no rebound, no guarding Ext:  2 plus pulses, no edema, no cyanosis, no clubbing Skin:  No rashes no nodules Neuro:  CN II through XII intact, motor grossly intact  EKG - nsr with left axis  DEVICE  Normal device function.  See PaceArt for details.   Assess/Plan:  Sinus node dysfunction - he is asymptomatic s/p PPM insertion. Syncope - none recently. HTN -his bp is good today. PVC's - s/p remote ablation.   Milena Liggett,MD

## 2022-01-18 ENCOUNTER — Encounter: Payer: Self-pay | Admitting: Internal Medicine

## 2022-01-29 ENCOUNTER — Ambulatory Visit (INDEPENDENT_AMBULATORY_CARE_PROVIDER_SITE_OTHER): Payer: Medicare HMO

## 2022-01-29 DIAGNOSIS — I495 Sick sinus syndrome: Secondary | ICD-10-CM

## 2022-01-29 LAB — CUP PACEART REMOTE DEVICE CHECK
Date Time Interrogation Session: 20231211093535
Implantable Lead Connection Status: 753985
Implantable Lead Connection Status: 753985
Implantable Lead Implant Date: 20130110
Implantable Lead Implant Date: 20130110
Implantable Lead Location: 753859
Implantable Lead Location: 753860
Implantable Lead Model: 350
Implantable Lead Model: 350
Implantable Pulse Generator Implant Date: 20230911
Pulse Gen Model: 407145
Pulse Gen Serial Number: 100044202

## 2022-02-09 ENCOUNTER — Ambulatory Visit: Payer: Medicare HMO | Admitting: Cardiology

## 2022-03-02 NOTE — Progress Notes (Signed)
Remote pacemaker transmission.   

## 2022-03-05 ENCOUNTER — Ambulatory Visit: Payer: Medicare HMO | Admitting: Cardiology

## 2022-03-05 NOTE — Progress Notes (Deleted)
Clinical Summary Mr. Deem is a 62 y.o.male  seen today for follow up of the following medical problems.      Previously followed at [facility]     1. Neurocardiogenic Syncope - ongoing several years, roughly 10 years - previusly followed at [facility] - from there notes history of neurocardiogenic syncope, had pacemaker placed for this reason.  - some mild presyncopal episodes.Several time a week. Often with standing up - stays well hydrated.      - last visit was to start  florinef 0.87m daily however did not fill the Rx - still with dizziness at times, often significant episodes when on the commode having a bowel movement.    2. Chronic chest pain - 08/2018 nuclear stress: no ischemia  - notes mention chronic chest pain nearly 10 years. Multiple stress tests negative, prior cath with normal coroarnies.      3. [place]- now followed by Dr ARayann Heman normal recent device check.      3. PSVT - [facility] notes mention prior ablation in 2013   Past Medical History:  Diagnosis Date   Anginal pain ([place]    "EVERY SINGLE DAY" (05/04/2015)   Arthritis    "fingers; ankles" (05/04/2015)   COPD (chronic obstructive pulmonary disease) (HCC)    Daily headache    "hopefully this neck OR will help" (05/04/2015)   Depression    GERD (gastroesophageal reflux disease)    History of gout    History of hiatal hernia    History of shingles    Hypertension    has never been on meds   Neck pain    buldging disc   Presence of permanent cardiac pacemaker    Sick sinus syndrome ([place]    Vasovagal syncope    last one Jan 2017     Allergies  Allergen Reactions   No Known Allergies      Current Outpatient Medications  Medication Sig Dispense Refill   aspirin EC 81 MG tablet Take 81 mg by mouth daily. Swallow whole.     fludrocortisone (FLORINEF) 0.1 MG tablet Take 0.1 mg by mouth daily as needed (to build up blood).      HYDROcodone-acetaminophen (NORCO/VICODIN) 5-325 MG tablet Take 1 tablet by mouth every 8 (eight) hours as needed.     ibuprofen (ADVIL) 800 MG tablet Take 800 mg by mouth daily.     methocarbamol (ROBAXIN) 750 MG tablet Take 750 mg by mouth every 4 (four) hours.     nitroGLYCERIN (NITROSTAT) 0.4 MG SL tablet Place 1 tablet (0.4 mg total) under the tongue every 5 (five) minutes x 3 doses as needed for chest pain (if no relief after 2nd dose, proceed to the ED or call 911). 25 tablet 2   rosuvastatin (CRESTOR) 10 MG tablet Take 10 mg by mouth daily.     No current facility-administered medications for this visit.     Past Surgical History:  Procedure Laterality Date   ANTERIOR CERVICAL DECOMP/DISCECTOMY FUSION  05/04/2015   ANTERIOR CERVICAL DECOMP/DISCECTOMY FUSION N/A 05/04/2015   Procedure: C6-7 Anterior Cervical Discectomy and Fusion, Allograft, Plate ;  Surgeon: MMarybelle Killings MD;  [facility]  Service: Orthopedics;  Laterality: N/A;   BACK SURGERY     CARDIAC CATHETERIZATION     2014   CARDIAC ELECTROPHYSIOLOGY MAPPING AND ABLATION     CARDIAC ELECTROPHYSIOLOGY STUDY & DFT  HERNIA REPAIR     LOOP RECORDER INSERTION  07/1999   Archie Endo 07/05/2010   LOOP RECORDER REMOVAL  07/2001   Archie Endo 07/05/2010   PACEMAKER IMPLANT  03/01/2011   Biotronik Evia implanted by Dr Norville Haggard at [facility] for sick sinus syndrome   POSTERIOR CERVICAL FUSION/FORAMINOTOMY N/A 04/20/2016   Procedure: c6-7 posterior fusion, ILIAC BONE MARROW ASPIRATE, WIRING;  Surgeon: Marybelle Killings, MD;  Location: [facility];  Service: Orthopedics;  Laterality: N/A;   PPM GENERATOR CHANGEOUT N/A 10/30/2021   Procedure: PPM GENERATOR CHANGEOUT;  Surgeon: Evans Lance, MD;  Location: [place] CV LAB;  Service: Cardiovascular;  Laterality: N/A;   SHOULDER ARTHROSCOPY W/ ROTATOR CUFF REPAIR Right    UMBILICAL HERNIA REPAIR       Allergies  Allergen Reactions   No Known Allergies       Family History  Problem Relation  Age of Onset   Diabetes Mother    Hypertension Mother    Hypertension Father    COPD Father    COPD Brother    Coronary artery disease Neg Hx      Social History Mr. Quinley reports that he has quit smoking. His smoking use included cigarettes. He has a 0.24 pack-year smoking history. He has never used smokeless tobacco. Mr. Geen reports current alcohol use.   Review of Systems CONSTITUTIONAL: No weight loss, fever, chills, weakness or fatigue.  HEENT: Eyes: No visual loss, blurred vision, double vision or yellow sclerae.No hearing loss, sneezing, congestion, runny nose or sore throat.  SKIN: No rash or itching.  CARDIOVASCULAR:  RESPIRATORY: No shortness of breath, cough or sputum.  GASTROINTESTINAL: No anorexia, nausea, vomiting or diarrhea. No abdominal pain or blood.  GENITOURINARY: No burning on urination, no polyuria NEUROLOGICAL: No headache, dizziness, syncope, paralysis, ataxia, numbness or tingling in the extremities. No change in bowel or bladder control.  MUSCULOSKELETAL: No muscle, back pain, joint pain or stiffness.  LYMPHATICS: No enlarged nodes. No history of splenectomy.  PSYCHIATRIC: No history of depression or anxiety.  ENDOCRINOLOGIC: No reports of sweating, cold or heat intolerance. No polyuria or polydipsia.  Marland Kitchen   Physical Examination There were no vitals filed for this visit. There were no vitals filed for this visit.  Gen: resting comfortably, no acute distress HEENT: no scleral icterus, pupils equal round and reactive, no palptable cervical adenopathy,  CV Resp: Clear to auscultation bilaterally GI: abdomen is soft, non-tender, non-distended, normal bowel sounds, no hepatosplenomegaly MSK: extremities are warm, no edema.  Skin: warm, no rash Neuro:  no focal deficits Psych: appropriate affect   Diagnostic Studies 08/2018 nuclear study Gated stress images:  Ejection fraction: >70%.  Wall motion abnormalities: None.   CONCLUSION:   1.  No  inducible ischemia.  2.  Normal left ventricular function (EF >70%).       05/2012 cath   OTHER:  Widely patent coronaries.  Normal left main.  Normal left ventricular function.       08/2011 [facility] EP ablation Summary:   1. NSR at baseline.  2. Spontaneous PVC of LBBB morphology, inferior and predominately rightward  (isoelectric in I, negative in aVL) directed axis identified. Mapping in the  LVOT at the level of the AV was 65mec later than surface activation. RVOT  mapping confirmed PVC's originated from area 3 of the RVOT and was 15mc  pre-QRS. RF application here induced automaticity with a 12/12 match to the  clinical PVC. The clinical PVC was not seen after the first  lesion despite  aggressive eipnephrine challenge.  3. No overt complications    TTE [id number] SUMMARY -  Overall left ventricular systolic function was normal. Left ventricular ejection fraction was estimated , range being 55 to 65 %.. This study was inadequate for the evaluation of left ventricular regional wall motion. -  Extremely limited due to poor sound wave transmission; LV function appears to be preserved; focal wall motion abnormality cannot be excluded; suggest TEE or MUGA if       clinically indicated.  NMS test: NOVANT 2001 IMPRESSION NEGATIVE FOR ISCHEMIA. LEFT VENTRICULAR EJECTION FRACTION 58%.    Assessment and Plan  1. Neurocardiogenic syncope - no syncopal episodes recently but several presycnopal episodes - we will try again to start florinef 0.68m daily. COntinue aggressive hydration, compression stockings, pacemaker followed by EP        JArnoldo Lenis M.D., F.A.C.C.

## 2022-03-08 NOTE — Progress Notes (Signed)
Cardiology Office Note:    Date:  03/09/2022   ID:  [name] [name], DOB January 07, 1961, MRN [id number]  PCP:  Jason Coop, FNP  [facility] Cardiologist:  Dina Rich, MD  [facility] Electrophysiologist:  None   Referring MD: Dorene Grebe*   Chief Complaint: 6 month follow-up  History of Present Illness:    [name] [name] is a 62 y.o. male with a hx of sinus node dysfunction s/p PPM insertion, vasovagal syncope, HTN, PVCs s/p prior ablation, COPD, and chronic chest pain.   Patient underwent PPM placement at [facility] in 2013 for neurocardiogenic syncope. He underwent PPM change out in 09/2021.  H/o chronic chest pan with multiple negative stress tests. Nuclear stress test in 08/2018 showed no ischemia.   The patient was last seen November 2023 and was overall doing well.   The patient reports pre-syncope episodes. HE may have 4-5 episodes a week. There is no inciting event. He was started on HCTZ for BP. He has occasional chest pain not necessarily worse with exertion. No shortness of breath or lower leg edema. He has a new diagnosis of OSA, he will use a CPAP machine.   Past Medical History:  Diagnosis Date   Anginal pain (HCC)    "EVERY SINGLE DAY" (05/04/2015)   Arthritis    "fingers; ankles" (05/04/2015)   COPD (chronic obstructive pulmonary disease) (HCC)    Daily headache    "hopefully this neck OR will help" (05/04/2015)   Depression    GERD (gastroesophageal reflux disease)    History of gout    History of hiatal hernia    History of shingles    Hypertension    has never been on meds   Neck pain    buldging disc   Presence of permanent cardiac pacemaker    Sick sinus syndrome (HCC)    Vasovagal syncope    last one Jan 2017    Past Surgical History:  Procedure Laterality Date   ANTERIOR CERVICAL DECOMP/DISCECTOMY FUSION  05/04/2015   ANTERIOR CERVICAL DECOMP/DISCECTOMY FUSION N/A 05/04/2015   Procedure: C6-7 Anterior Cervical  Discectomy and Fusion, Allograft, Plate ;  Surgeon: Eldred Manges, MD;  Location: [facility];  Service: Orthopedics;  Laterality: N/A;   BACK SURGERY     CARDIAC CATHETERIZATION     2014   CARDIAC ELECTROPHYSIOLOGY MAPPING AND ABLATION     CARDIAC ELECTROPHYSIOLOGY STUDY & DFT     HERNIA REPAIR     LOOP RECORDER INSERTION  07/1999   [name] 07/05/2010   LOOP RECORDER REMOVAL  07/2001   [name] 07/05/2010   PACEMAKER IMPLANT  03/01/2011   Biotronik Evia implanted by Dr Luella Cook at [facility] for sick sinus syndrome   POSTERIOR CERVICAL FUSION/FORAMINOTOMY N/A 04/20/2016   Procedure: c6-7 posterior fusion, ILIAC BONE MARROW ASPIRATE, WIRING;  Surgeon: Eldred Manges, MD;  Location: [facility] OR;  Service: Orthopedics;  Laterality: N/A;   PPM GENERATOR CHANGEOUT N/A 10/30/2021   Procedure: PPM GENERATOR CHANGEOUT;  Surgeon: Marinus Maw, MD;  Location: [facility] INVASIVE CV LAB;  Service: Cardiovascular;  Laterality: N/A;   SHOULDER ARTHROSCOPY W/ ROTATOR CUFF REPAIR [name]    UMBILICAL HERNIA REPAIR      Current Medications: Current Meds  Medication Sig   aspirin EC 81 MG tablet Take 81 mg by mouth daily. Swallow whole.   fludrocortisone (FLORINEF) 0.1 MG tablet Take 0.1 mg by mouth daily as needed (to build up blood).   HYDROcodone-acetaminophen (NORCO/VICODIN)  5-325 MG tablet Take 1 tablet by mouth every 8 (eight) hours as needed.   ibuprofen (ADVIL) 800 MG tablet Take 800 mg by mouth daily.   methocarbamol (ROBAXIN) 750 MG tablet Take 750 mg by mouth every 4 (four) hours.   nitroGLYCERIN (NITROSTAT) 0.4 MG SL tablet Place 1 tablet (0.4 mg total) under the tongue every 5 (five) minutes x 3 doses as needed for chest pain (if no relief after 2nd dose, proceed to the ED or call 911).   rosuvastatin (CRESTOR) 10 MG tablet Take 10 mg by mouth daily.   [DISCONTINUED] hydrochlorothiazide (HYDRODIURIL) 25 MG tablet Take 25 mg by mouth daily.     Allergies:   No known allergies   Social History   Socioeconomic  History   Marital status: Married    Spouse name: Not on file   Number of children: Not on file   Years of education: Not on file   Highest education level: Not on file  Occupational History   Occupation: Full time  Tobacco Use   Smoking status: Former    Packs/day: 0.12    Years: 2.00    Total pack years: 0.24    Types: Cigarettes   Smokeless tobacco: Never   Tobacco comments:    quit smoking in 1981  Vaping Use   Vaping Use: Never used  Substance and Sexual Activity   Alcohol use: Yes    Comment: occasional   Drug use: Not Currently    Frequency: 3.0 times per week    Types: Marijuana    Comment: 2-3 times per week    Sexual activity: Yes  Other Topics Concern   Not on file  Social History Narrative   Married   Social Determinants of Health   Financial Resource Strain: Not on file  Food Insecurity: Not on file  Transportation Needs: Not on file  Physical Activity: Not on file  Stress: Not on file  Social Connections: Not on file     Family History: The patient's family history includes COPD in his brother and father; Diabetes in his mother; Hypertension in his father and mother. There is no history of Coronary artery disease.  ROS:   Please see the history of present illness.     All other systems reviewed and are negative.  EKGs/Labs/Other Studies Reviewed:    The following studies were reviewed today:  N/A  EKG:  EKG is not ordered today.    Recent Labs: 10/12/2021: BUN 18; Creatinine, Ser 1.01; Hemoglobin 14.3; Platelets 250; Potassium 4.2; Sodium 138  Recent Lipid Panel No results found for: "CHOL", "TRIG", "HDL", "CHOLHDL", "VLDL", "LDLCALC", "LDLDIRECT"  Physical Exam:    VS:  BP 118/82   Pulse 78   Ht 5\' 9"  (1.753 m)   Wt 219 lb 3.2 oz (99.4 kg)   SpO2 98%   BMI 32.37 kg/m     Wt Readings from Last 3 Encounters:  03/09/22 219 lb 3.2 oz (99.4 kg)  01/16/22 222 lb (100.7 kg)  01/04/22 215 lb (97.5 kg)     GEN:  Well nourished, well  developed in no acute distress HEENT: Normal NECK: No JVD; No carotid bruits LYMPHATICS: No lymphadenopathy CARDIAC: RRR, no murmurs, rubs, gallops RESPIRATORY:  Clear to auscultation without rales, wheezing or rhonchi  ABDOMEN: Soft, non-tender, non-distended MUSCULOSKELETAL:  No edema; No deformity  SKIN: Warm and dry NEUROLOGIC:  Alert and oriented x 3 PSYCHIATRIC:  Normal affect   ASSESSMENT:    1. Sick sinus syndrome (  [place])   2. Pacemaker   3. Screening for hyperlipidemia   4. Syncope and collapse   5. Essential hypertension   6. Hyperlipidemia, mixed   7. OSA (obstructive sleep apnea)    PLAN:    In order of problems listed above:  Sinus node dysfunction S/p PPM followed by Dr. Lovena Le. Normally functioning device so far.   Neurocardiogenic syncope/presyncope Patient has chronic known pre-syncopal episodes. Continue with salt liberalization and hydration. Continue Florinef 0.1mg  daily. He was started on HCTZ since the last visit, however unable to find chart notes to review. I will stop HCTZ.   HTN BP is good today. Stop HCTZ as above. Continue to monitor at home.   PVCs S/p prior ablation at [facility].  HLD I will update a cholesterol panel. Continue Crestor 10mg  daily.   OSA He was recently diagnosed with OSA, plans on getting a CPAP machine.   Disposition: Follow up in 6 month(s) with Md/APP     Signed, Basilia Stuckert Ninfa Meeker, PA-C  03/09/2022 2:57 PM    [facility]

## 2022-03-09 ENCOUNTER — Encounter: Payer: Self-pay | Admitting: Medical

## 2022-03-09 ENCOUNTER — Ambulatory Visit: Payer: Medicare HMO | Attending: Cardiology | Admitting: Medical

## 2022-03-09 VITALS — BP 118/82 | HR 78 | Ht 69.0 in | Wt 219.2 lb

## 2022-03-09 DIAGNOSIS — Z950 Presence of cardiac pacemaker: Secondary | ICD-10-CM

## 2022-03-09 DIAGNOSIS — R55 Syncope and collapse: Secondary | ICD-10-CM | POA: Diagnosis not present

## 2022-03-09 DIAGNOSIS — I495 Sick sinus syndrome: Secondary | ICD-10-CM | POA: Diagnosis not present

## 2022-03-09 DIAGNOSIS — Z13220 Encounter for screening for lipoid disorders: Secondary | ICD-10-CM | POA: Diagnosis not present

## 2022-03-09 DIAGNOSIS — I10 Essential (primary) hypertension: Secondary | ICD-10-CM

## 2022-03-09 DIAGNOSIS — E782 Mixed hyperlipidemia: Secondary | ICD-10-CM

## 2022-03-09 DIAGNOSIS — G4733 Obstructive sleep apnea (adult) (pediatric): Secondary | ICD-10-CM

## 2022-03-09 NOTE — Patient Instructions (Signed)
Medication Instructions:  Your physician has recommended you make the following change in your medication:  -Stop Hydrochlorothiazide    Labwork: Fasting Lipid Panel- Do not eat/drink at least 6 hours prior to lab.   Testing/Procedures: None  Follow-Up: Follow up with Dr. Christella Noa in 6 months   Any Other Special Instructions Will Be Listed Below (If Applicable).     If you need a refill on your cardiac medications before your next appointment, please call your pharmacy.

## 2022-03-25 ENCOUNTER — Encounter (HOSPITAL_COMMUNITY): Payer: Self-pay | Admitting: Emergency Medicine

## 2022-03-25 ENCOUNTER — Emergency Department (HOSPITAL_COMMUNITY): Payer: Medicare HMO

## 2022-03-25 ENCOUNTER — Other Ambulatory Visit: Payer: Self-pay

## 2022-03-25 ENCOUNTER — Emergency Department (HOSPITAL_COMMUNITY)
Admission: EM | Admit: 2022-03-25 | Discharge: 2022-03-25 | Disposition: A | Discharge status: Home / Self Care | Payer: Medicare HMO | Attending: Emergency Medicine | Admitting: Emergency Medicine

## 2022-03-25 DIAGNOSIS — Y92410 Unspecified street and highway as the place of occurrence of the external cause: Secondary | ICD-10-CM | POA: Diagnosis not present

## 2022-03-25 DIAGNOSIS — M25511 Pain in right shoulder: Secondary | ICD-10-CM | POA: Diagnosis present

## 2022-03-25 DIAGNOSIS — G8911 Acute pain due to trauma: Secondary | ICD-10-CM | POA: Diagnosis not present

## 2022-03-25 DIAGNOSIS — Z7982 Long term (current) use of aspirin: Secondary | ICD-10-CM | POA: Diagnosis not present

## 2022-03-25 MED ORDER — METHOCARBAMOL 500 MG PO TABS: 500.0000 mg | ORAL_TABLET | Freq: Two times a day (BID) | ORAL | 0 refills | Status: AC

## 2022-03-25 MED ORDER — OXYCODONE-ACETAMINOPHEN 5-325 MG PO TABS
1.0000 | ORAL_TABLET | Freq: Once | ORAL | Status: AC
  Administered 2022-03-25: 1 via ORAL
  Filled 2022-03-25: qty 1

## 2022-03-25 MED ORDER — METHOCARBAMOL 500 MG PO TABS
500.0000 mg | ORAL_TABLET | Freq: Once | ORAL | Status: AC
  Administered 2022-03-25: 500 mg via ORAL
  Filled 2022-03-25: qty 1

## 2022-03-25 MED ORDER — HYDROCODONE-ACETAMINOPHEN 5-325 MG PO TABS: 1.0000 | ORAL_TABLET | Freq: Three times a day (TID) | ORAL | 0 refills | Status: DC | PRN

## 2022-03-25 NOTE — Discharge Instructions (Addendum)
It was a pleasure taking care of you today!   Your xrays didn't show any emergent findings of fracture or dislocation at this time. You will be sent a short course of percocet, take as directed for breakthrough pain. Do not take tylenol while taking this medication. You will be sent a prescription for Robaxin, take as directed. Do not take this medication at the same time as the Norco as it can cause increased drowsiness. Do not drive or operate heavy machinery while taking Robaxin or Norco. You will be provided with a sling today, it is recommended that you do not wear the sling for longer than 48 hours at a time. Attached is information for the orthopedist, to follow up as needed regarding todays ED visit. You may follow up with your primary care provider as needed. Return to the ED if you are experiencing increasing/worsening symptoms.
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
## 2022-03-25 NOTE — ED Notes (Signed)
ED Provider at bedside. 

## 2022-04-19 ENCOUNTER — Other Ambulatory Visit (HOSPITAL_COMMUNITY): Payer: Self-pay | Admitting: Orthopedic Surgery

## 2022-04-19 DIAGNOSIS — M25511 Pain in right shoulder: Secondary | ICD-10-CM

## 2022-04-19 DIAGNOSIS — G8929 Other chronic pain: Secondary | ICD-10-CM

## 2022-04-30 ENCOUNTER — Ambulatory Visit: Payer: Medicare HMO

## 2022-04-30 ENCOUNTER — Other Ambulatory Visit: Payer: Self-pay | Admitting: Orthopedic Surgery

## 2022-04-30 DIAGNOSIS — I495 Sick sinus syndrome: Secondary | ICD-10-CM | POA: Diagnosis not present

## 2022-04-30 DIAGNOSIS — G8929 Other chronic pain: Secondary | ICD-10-CM

## 2022-04-30 DIAGNOSIS — M25511 Pain in right shoulder: Secondary | ICD-10-CM

## 2022-05-01 LAB — CUP PACEART REMOTE DEVICE CHECK
Battery Voltage: 100
Date Time Interrogation Session: 20240311090413
Implantable Lead Connection Status: 753985
Implantable Lead Connection Status: 753985
Implantable Lead Implant Date: 20130110
Implantable Lead Implant Date: 20130110
Implantable Lead Location: 753859
Implantable Lead Location: 753860
Implantable Lead Model: 350
Implantable Lead Model: 350
Implantable Pulse Generator Implant Date: 20230911
Pulse Gen Model: 407145
Pulse Gen Serial Number: 100044202

## 2022-05-02 ENCOUNTER — Ambulatory Visit (HOSPITAL_COMMUNITY)
Admission: RE | Admit: 2022-05-02 | Discharge: 2022-05-02 | Disposition: A | Discharge status: Home / Self Care | Payer: Medicare HMO | Source: Ambulatory Visit | Attending: Orthopedic Surgery | Admitting: Orthopedic Surgery

## 2022-05-02 ENCOUNTER — Encounter (HOSPITAL_COMMUNITY): Payer: Self-pay

## 2022-05-02 DIAGNOSIS — G8929 Other chronic pain: Secondary | ICD-10-CM

## 2022-05-02 DIAGNOSIS — M25511 Pain in right shoulder: Secondary | ICD-10-CM
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
## 2022-05-09 DIAGNOSIS — Z950 Presence of cardiac pacemaker: Secondary | ICD-10-CM | POA: Insufficient documentation

## 2022-05-09 DIAGNOSIS — I10 Essential (primary) hypertension: Secondary | ICD-10-CM | POA: Insufficient documentation

## 2022-05-09 DIAGNOSIS — G4733 Obstructive sleep apnea (adult) (pediatric): Secondary | ICD-10-CM | POA: Insufficient documentation

## 2022-05-09 DIAGNOSIS — I495 Sick sinus syndrome: Secondary | ICD-10-CM | POA: Insufficient documentation

## 2022-05-10 ENCOUNTER — Ambulatory Visit: Payer: Medicare HMO | Attending: Cardiology | Admitting: *Deleted

## 2022-05-10 ENCOUNTER — Telehealth: Payer: Self-pay | Admitting: Internal Medicine

## 2022-05-10 VITALS — BP 146/88 | HR 91 | Ht 69.0 in | Wt 215.6 lb

## 2022-05-10 DIAGNOSIS — I10 Essential (primary) hypertension: Secondary | ICD-10-CM | POA: Diagnosis not present

## 2022-05-10 MED ORDER — ROSUVASTATIN CALCIUM 10 MG PO TABS: 10.0000 mg | ORAL_TABLET | Freq: Every day | ORAL | 3 refills | Status: DC

## 2022-05-10 MED ORDER — FLUDROCORTISONE ACETATE 0.1 MG PO TABS: 0.1000 mg | ORAL_TABLET | Freq: Every day | ORAL | 11 refills | Status: DC | PRN

## 2022-05-10 NOTE — Telephone Encounter (Signed)
Pt c/o BP issue: STAT if pt c/o blurred vision, one-sided weakness or slurred speech  1. What are your last 5 BP readings?   194/100 something (pt unsure of bottom number)  2. Are you having any other symptoms (ex. Dizziness, headache, blurred vision, passed out)? Blurry vision, dizzy, really sweaty (feet and hands are cold, but body is hot)    3. What is your BP issue? Pt states last three days his bp has been high. Please advise.

## 2022-05-10 NOTE — Progress Notes (Signed)
Vitals and EKG here look ok. If high bp's at home would try taking florinef every other day as opposed to every day. Update [place] on symptoms and bp's next week. Clarify how many florinef tablets he is taking daily please   Zandra Abts MD

## 2022-05-10 NOTE — Telephone Encounter (Signed)
Returned call to pt. No answer. Left msg to call back.  

## 2022-05-10 NOTE — Progress Notes (Signed)
Pt states that his daughter came by and showed him how to correctly take his BP. Pt states that his cuff is a wrist cuff and will get a arm cuff. Pt states that he want to have an MRI done and was told that his pacemaker was making too much noise to have done. Pt states that at times he is wet with sweat. He has not wanted to eat. He c/o being SOB at times.

## 2022-05-10 NOTE — Telephone Encounter (Signed)
Pt returning call
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
## 2022-05-10 NOTE — Telephone Encounter (Signed)
Pt states that he is taking Florinef daily. Pt scheduled for nurse visit today.

## 2022-05-15 ENCOUNTER — Encounter: Payer: Self-pay | Admitting: Physician Assistant

## 2022-05-15 ENCOUNTER — Ambulatory Visit: Payer: Medicare HMO | Attending: Internal Medicine | Admitting: Cardiology

## 2022-05-15 VITALS — BP 120/80 | HR 94 | Ht 69.0 in | Wt 218.0 lb

## 2022-05-15 DIAGNOSIS — R55 Syncope and collapse: Secondary | ICD-10-CM

## 2022-05-15 DIAGNOSIS — R079 Chest pain, unspecified: Secondary | ICD-10-CM

## 2022-05-15 DIAGNOSIS — I495 Sick sinus syndrome: Secondary | ICD-10-CM | POA: Diagnosis not present

## 2022-05-15 DIAGNOSIS — Z950 Presence of cardiac pacemaker: Secondary | ICD-10-CM | POA: Diagnosis not present

## 2022-05-15 DIAGNOSIS — G4733 Obstructive sleep apnea (adult) (pediatric): Secondary | ICD-10-CM

## 2022-05-15 DIAGNOSIS — T82120A Displacement of cardiac electrode, initial encounter: Secondary | ICD-10-CM

## 2022-05-15 DIAGNOSIS — I10 Essential (primary) hypertension: Secondary | ICD-10-CM

## 2022-05-15 LAB — CUP PACEART INCLINIC DEVICE CHECK
Date Time Interrogation Session: 20240326114216
Implantable Lead Connection Status: 753985
Implantable Lead Connection Status: 753985
Implantable Lead Implant Date: 20130110
Implantable Lead Implant Date: 20130110
Implantable Lead Location: 753859
Implantable Lead Location: 753860
Implantable Lead Model: 350
Implantable Lead Model: 350
Implantable Pulse Generator Implant Date: 20230911
Pulse Gen Model: 407145
Pulse Gen Serial Number: 100044202

## 2022-05-15 NOTE — Patient Instructions (Signed)
Medication Instructions:   Your physician recommends that you continue on your current medications as directed. Please refer to the Current Medication list given to you today.   *If you need a refill on your cardiac medications before your next appointment, please call your pharmacy*   Lab Work:  [place]   If you have labs (blood work) drawn today and your tests are completely normal, you will receive your results only by: [place] (if you have MyChart) OR A paper copy in the mail If you have any lab test that is abnormal or we need to change your treatment, we will call you to review the results.   Testing/Procedures: A chest x-ray takes a picture of the organs and structures inside the chest, including the heart, lungs, and blood vessels. This test can show several things, including, whether the heart is enlarges; whether fluid is building up in the lungs; and whether pacemaker / defibrillator leads are still in place.   Located in: [facility] Address: [address], [place], [place] [address] Areas served: [place] and nearby areas Hours:  Tuesday 8AM-5:30PM Wednesday 8AM-5:30PM Thursday 8AM-5:30PM Friday 8AM-5:30PM Saturday Closed Sunday Closed Monday 8AM-5:30PM Phone: [phone]   Follow-Up: At [facility], you and your health needs are our priority.  As part of our continuing mission to provide you with exceptional heart care, we have created designated Provider Care Teams.  These Care Teams include your primary Cardiologist (physician) and Advanced Practice Providers (APPs -  Physician Assistants and Nurse Practitioners) who all work together to provide you with the care you need, when you need it.  We recommend signing up for the patient portal called "MyChart".  Sign up information is provided on this After Visit Summary.  MyChart is used to connect with patients for Virtual Visits (Telemedicine).  Patients are  able to view lab/test results, encounter notes, upcoming appointments, etc.  Non-urgent messages can be sent to your provider as well.   To learn more about what you can do with MyChart, go to [email or web address].    Your next appointment:   3-4 week(s)  Provider:   Cristopher Peru, MD    Other Instructions

## 2022-05-18 ENCOUNTER — Ambulatory Visit
Admission: RE | Admit: 2022-05-18 | Discharge: 2022-05-18 | Disposition: A | Discharge status: Home / Self Care | Payer: Medicare HMO | Source: Ambulatory Visit | Attending: Cardiology | Admitting: Cardiology

## 2022-05-18 DIAGNOSIS — T82120A Displacement of cardiac electrode, initial encounter: Secondary | ICD-10-CM

## 2022-05-21 ENCOUNTER — Ambulatory Visit (INDEPENDENT_AMBULATORY_CARE_PROVIDER_SITE_OTHER): Payer: Medicare HMO | Admitting: Pulmonary Disease

## 2022-05-21 ENCOUNTER — Encounter: Payer: Self-pay | Admitting: Pulmonary Disease

## 2022-05-21 ENCOUNTER — Ambulatory Visit (HOSPITAL_COMMUNITY)
Admission: RE | Admit: 2022-05-21 | Discharge: 2022-05-21 | Disposition: A | Discharge status: Home / Self Care | Payer: Medicare HMO | Source: Ambulatory Visit | Attending: Pulmonary Disease | Admitting: Pulmonary Disease

## 2022-05-21 VITALS — BP 134/84 | HR 82 | Wt 224.6 lb

## 2022-05-21 DIAGNOSIS — G4733 Obstructive sleep apnea (adult) (pediatric): Secondary | ICD-10-CM

## 2022-05-21 DIAGNOSIS — R053 Chronic cough: Secondary | ICD-10-CM | POA: Insufficient documentation
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
## 2022-05-21 NOTE — Patient Instructions (Signed)
Will send order again to get CPAP set up.  Chest xray at [facility] today.  Will arrange for breathing test (PFT) at [facility].  Follow up in 4 months.

## 2022-05-25 ENCOUNTER — Encounter: Payer: Medicare HMO | Admitting: Internal Medicine

## 2022-05-29 ENCOUNTER — Encounter: Payer: Medicare HMO | Admitting: Internal Medicine

## 2022-06-03 IMAGING — CT CT CHEST W/ CM
2 of 3 series · 15 of 36 positions shown, 18 images · IV contrast (Omnipaque or Isovue)
Comparison: Chest x-ray from earlier the same day.

CLINICAL DATA: Lingular nodule, follow-up exam, evaluate for
possible metallic foreign body

EXAM:
CT CHEST WITH CONTRAST
TECHNIQUE: Multidetector CT imaging of the chest was performed during
intravenous contrast administration.
CONTRAST:  75mL OMNIPAQUE IOHEXOL 300 MG/ML  SOLN

[Series 2: routine chest with · axial · 0.81mm/px · z∈[-464,-158]mm · 12 of 181 slices shown, 15 images]
[im 14/181  mediastinal]
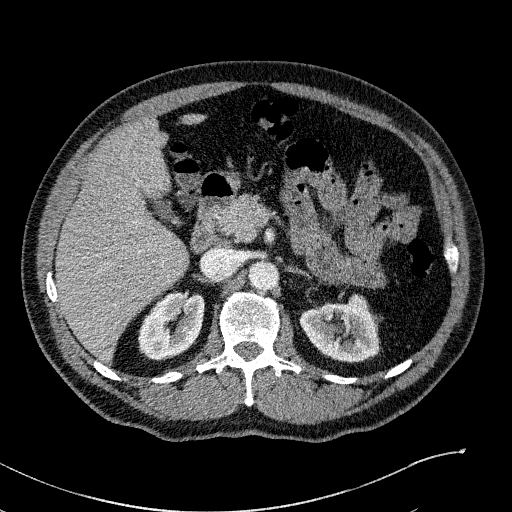
[im 14/181  lung]
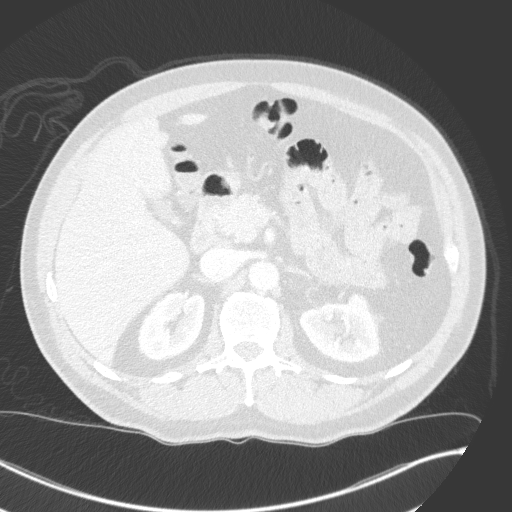
[im 27/181  lung]
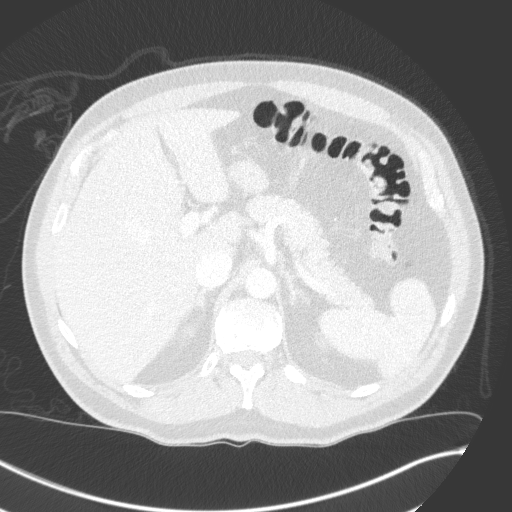
[im 41/181  lung]
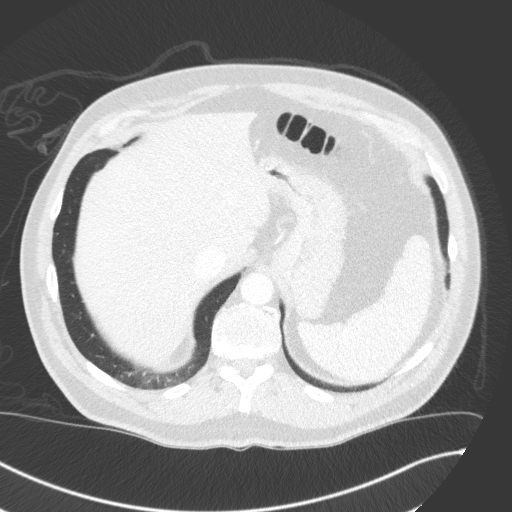
[im 54/181  lung]
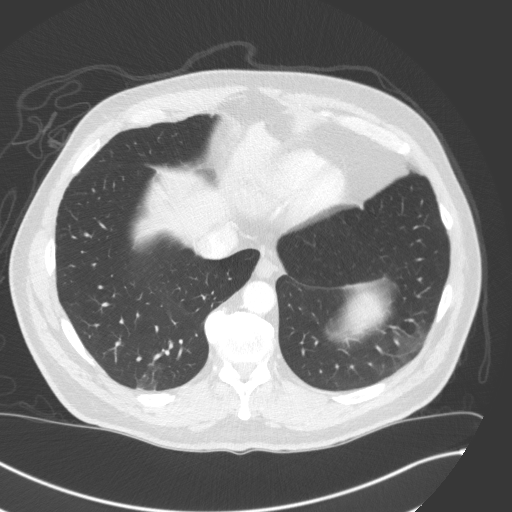
[im 67/181  mediastinal]
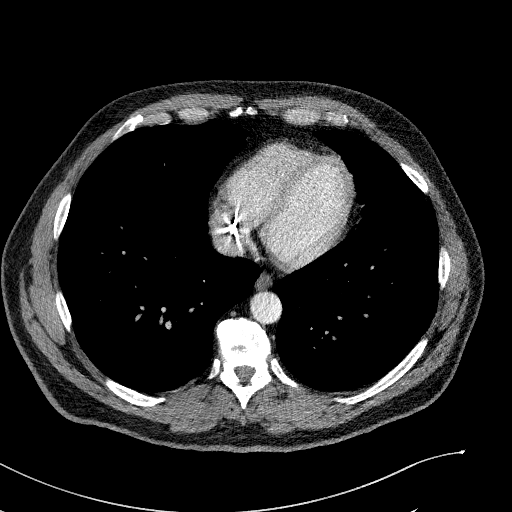
[im 67/181  lung]
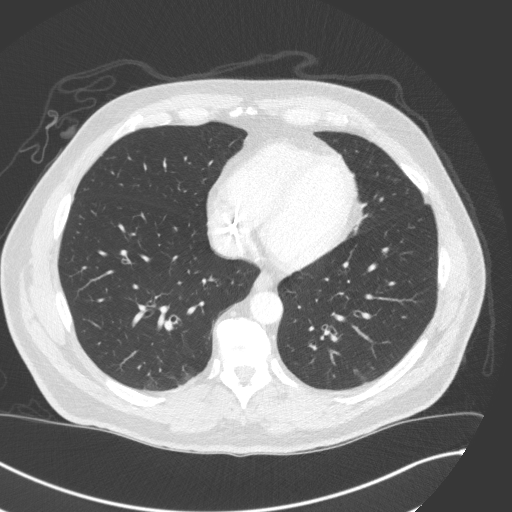
[im 81/181  lung]
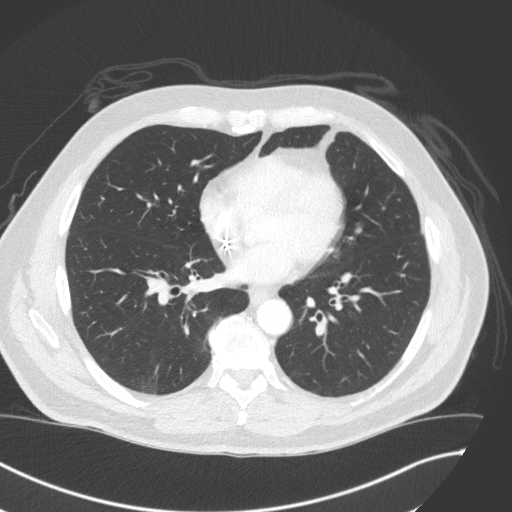
[im 101/181  lung]
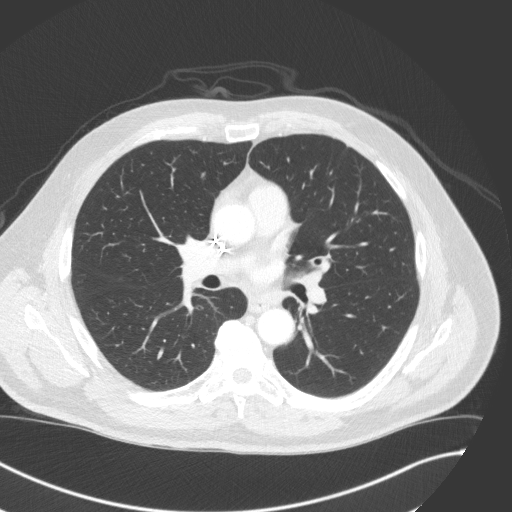
[im 114/181  lung]
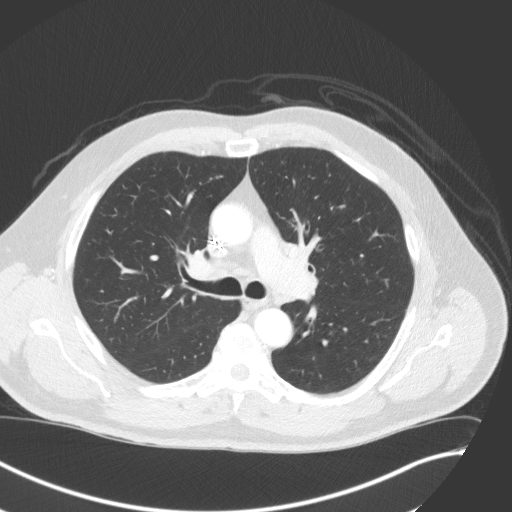
[im 127/181  mediastinal]
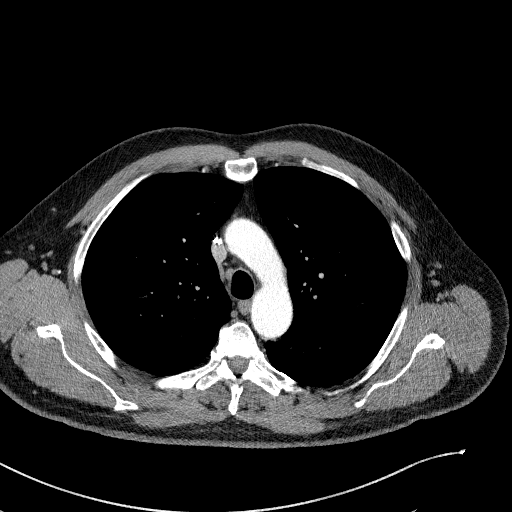
[im 127/181  lung]
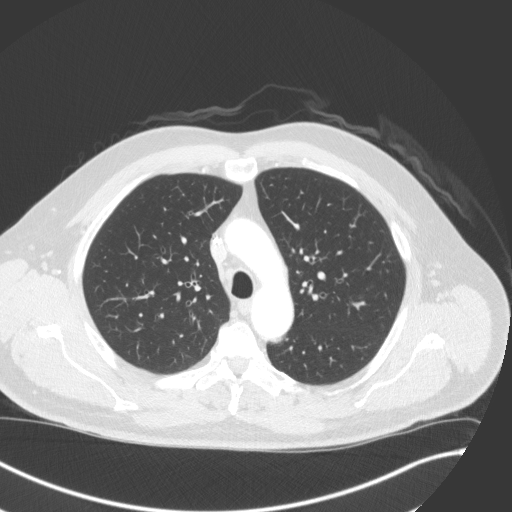
[im 141/181  lung]
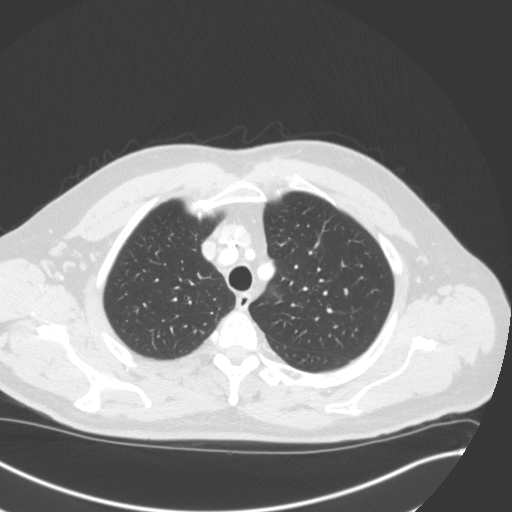
[im 154/181  lung]
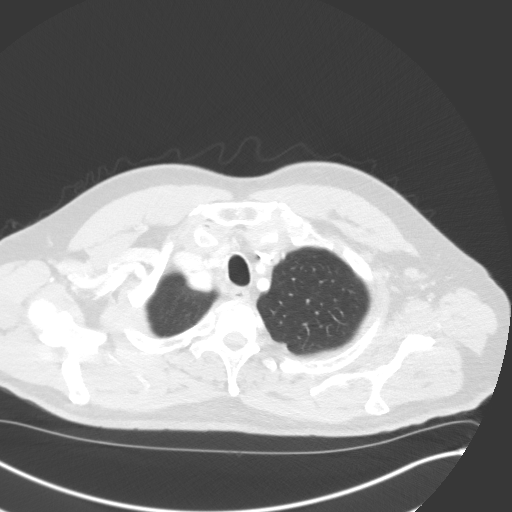
[im 167/181  lung]
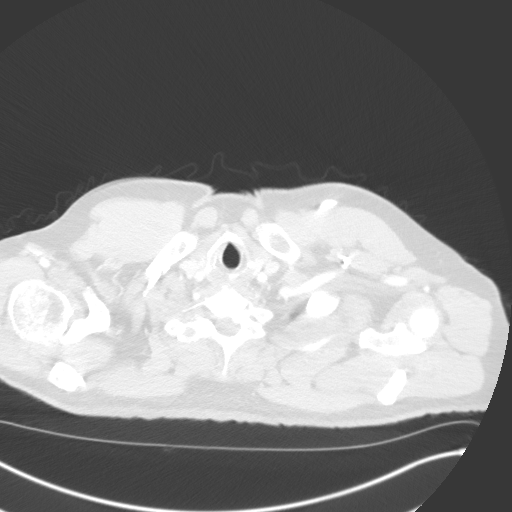

[Series 5: coronal · coronal · 0.76mm/px · 3 of 161 slices shown]
[im 33/161  lung]
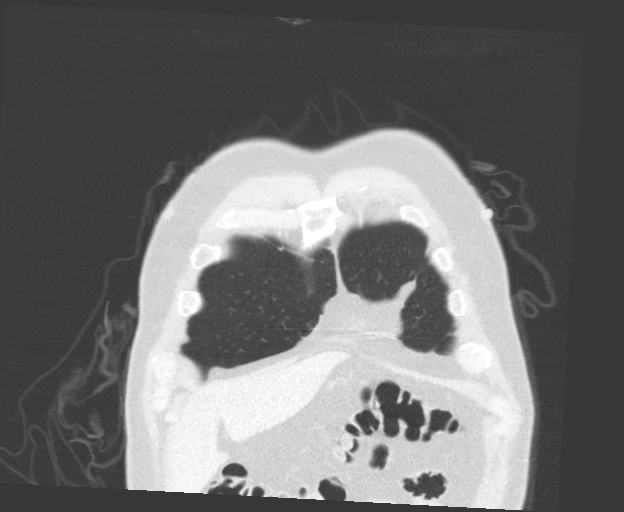
[im 65/161  lung]
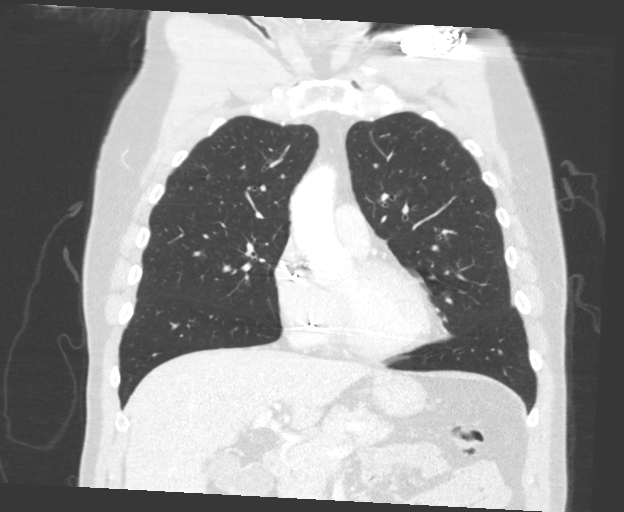
[im 97/161  lung]
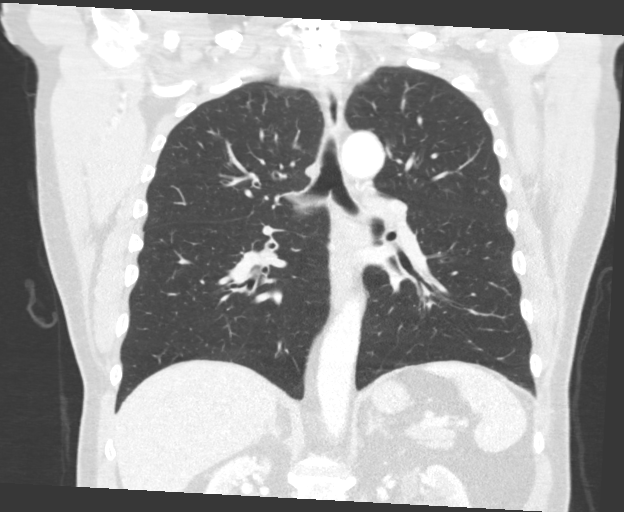

[15 of 36 positions shown; findings below may reference images not displayed]

FINDINGS: Cardiovascular: Thoracic aorta and its branches show no aneurysmal
dilatation or dissection. Mild atherosclerotic calcifications are
seen. No cardiac enlargement is noted. Pacing device is again seen.
Pulmonary artery as visualized is within normal limits although
poorly opacified.

Mediastinum/Nodes: Thoracic inlet is within normal limits. No
sizable hilar or mediastinal adenopathy is noted. The esophagus is
within normal limits.

Lungs/Pleura: The lungs are well aerated bilaterally. The lingula
demonstrates mild atelectatic changes/scarring which corresponds
with that seen on recent chest x-ray. No sizable effusion or
pneumothorax is noted. No other focal infiltrate is seen. A 4 mm
nodule is noted in the right lower lobe best seen on image number
121 of series 4. This nodule is stable from prior exam dating back
to 3453 by report. No other focal nodules are seen.

Upper Abdomen: Visualized upper abdomen is within normal limits.

Musculoskeletal: Degenerative changes of the thoracic spine are
noted. Postsurgical changes in the cervical spine are seen.
Surrounding soft tissue structures are within normal limits.
Previously seen metallic density represents an extrinsic abnormality
and is not visualized on this exam.
IMPRESSION: Lingular atelectasis/scarring which corresponds with the finding
seen on recent plain film examination.

Stable 4 mm nodule in the right lower lobe dating back to 3453.

Metallic foreign body seen previously is not within the patient and
his extrinsic in the patient's clothes.

Aortic Atherosclerosis ([id number]-[id number]).

## 2022-06-05 NOTE — Progress Notes (Signed)
Remote pacemaker transmission.   

## 2022-06-27 ENCOUNTER — Ambulatory Visit: Payer: Medicare HMO | Attending: Internal Medicine | Admitting: Internal Medicine

## 2022-06-27 ENCOUNTER — Encounter: Payer: Self-pay | Admitting: Internal Medicine

## 2022-06-27 VITALS — BP 124/80 | HR 61 | Ht 69.0 in | Wt 219.0 lb

## 2022-06-27 DIAGNOSIS — I495 Sick sinus syndrome: Secondary | ICD-10-CM | POA: Diagnosis not present

## 2022-06-27 LAB — CUP PACEART INCLINIC DEVICE CHECK
Date Time Interrogation Session: 20240508095400
Implantable Lead Connection Status: 753985
Implantable Lead Connection Status: 753985
Implantable Lead Implant Date: 20130110
Implantable Lead Implant Date: 20130110
Implantable Lead Location: 753859
Implantable Lead Location: 753860
Implantable Lead Model: 350
Implantable Lead Model: 350
Implantable Pulse Generator Implant Date: 20230911
Pulse Gen Model: 407145
Pulse Gen Serial Number: 100044202

## 2022-06-27 NOTE — Patient Instructions (Signed)
Medication Instructions:  Your physician recommends that you continue on your current medications as directed. Please refer to the Current Medication list given to you today.  *If you need a refill on your cardiac medications before your next appointment, please call your pharmacy*   Lab Work: NONE   If you have labs (blood work) drawn today and your tests are completely normal, you will receive your results only by: MyChart Message (if you have MyChart) OR A paper copy in the mail If you have any lab test that is abnormal or we need to change your treatment, we will call you to review the results.   Testing/Procedures: NONE    Follow-Up: At [facility], you and your health needs are our priority.  As part of our continuing mission to provide you with exceptional heart care, we have created designated Provider Care Teams.  These Care Teams include your primary Cardiologist (physician) and Advanced Practice Providers (APPs -  Physician Assistants and Nurse Practitioners) who all work together to provide you with the care you need, when you need it.  We recommend signing up for the patient portal called "MyChart".  Sign up information is provided on this After Visit Summary.  MyChart is used to connect with patients for Virtual Visits (Telemedicine).  Patients are able to view lab/test results, encounter notes, upcoming appointments, etc.  Non-urgent messages can be sent to your provider as well.   To learn more about what you can do with MyChart, go to [email or web address].    Your next appointment:   1 year(s)  Provider:   Gregg Taylor, MD    Other Instructions Thank you for choosing [facility]!    

## 2022-06-27 NOTE — Progress Notes (Signed)
HPI [name] returns today for followup. He is a pleasant 62 yo man with sinus node dysfunction s/p PPM insertion. He also has autonomic dysfunction but has not passed out. His PPM was changed out over a year ago. He denies sob. He has non-cardiac chest pain. He has undergone shoulder surgery.  Allergies  Allergen Reactions   No Known Allergies      Current Outpatient Medications  Medication Sig Dispense Refill   aspirin EC 81 MG tablet Take 81 mg by mouth daily. Swallow whole.     fludrocortisone (FLORINEF) 0.1 MG tablet Take 1 tablet (0.1 mg total) by mouth daily as needed (to build up blood). 30 tablet 11   HYDROcodone-acetaminophen (NORCO/VICODIN) 5-325 MG tablet Take 1 tablet by mouth every 8 (eight) hours as needed. 10 tablet 0   ibuprofen (ADVIL) 800 MG tablet Take 800 mg by mouth daily.     nitroGLYCERIN (NITROSTAT) 0.4 MG SL tablet Place 1 tablet (0.4 mg total) under the tongue every 5 (five) minutes x 3 doses as needed for chest pain (if no relief after 2nd dose, proceed to the ED or call 911). 25 tablet 2   rosuvastatin (CRESTOR) 10 MG tablet Take 1 tablet (10 mg total) by mouth daily. 90 tablet 3   No current facility-administered medications for this visit.     Past Medical History:  Diagnosis Date   Anginal pain (HCC)    "EVERY SINGLE DAY" (05/04/2015)   Arthritis    "fingers; ankles" (05/04/2015)   COPD (chronic obstructive pulmonary disease) (HCC)    Daily headache    "hopefully this neck OR will help" (05/04/2015)   Depression    GERD (gastroesophageal reflux disease)    History of gout    History of hiatal hernia    History of shingles    Hypertension    has never been on meds   Neck pain    buldging disc   Presence of permanent cardiac pacemaker    Sick sinus syndrome (HCC)    Vasovagal syncope    last one Jan 2017    ROS:   All systems reviewed and negative except as noted in the HPI.   Past Surgical History:  Procedure Laterality Date    ANTERIOR CERVICAL DECOMP/DISCECTOMY FUSION  05/04/2015   ANTERIOR CERVICAL DECOMP/DISCECTOMY FUSION N/A 05/04/2015   Procedure: C6-7 Anterior Cervical Discectomy and Fusion, Allograft, Plate ;  Surgeon: Eldred Manges, MD;  Location: [facility] OR;  Service: Orthopedics;  Laterality: N/A;   BACK SURGERY     CARDIAC CATHETERIZATION     2014   CARDIAC ELECTROPHYSIOLOGY MAPPING AND ABLATION     CARDIAC ELECTROPHYSIOLOGY STUDY & DFT     HERNIA REPAIR     LOOP RECORDER INSERTION  07/1999   [name] 07/05/2010   LOOP RECORDER REMOVAL  07/2001   [name] 07/05/2010   PACEMAKER IMPLANT  03/01/2011   Biotronik Evia implanted by Dr Luella Cook at [facility] for sick sinus syndrome   POSTERIOR CERVICAL FUSION/FORAMINOTOMY N/A 04/20/2016   Procedure: c6-7 posterior fusion, ILIAC BONE MARROW ASPIRATE, WIRING;  Surgeon: Eldred Manges, MD;  Location: [facility] OR;  Service: Orthopedics;  Laterality: N/A;   PPM GENERATOR CHANGEOUT N/A 10/30/2021   Procedure: PPM GENERATOR CHANGEOUT;  Surgeon: Marinus Maw, MD;  Location: [facility] INVASIVE CV LAB;  Service: Cardiovascular;  Laterality: N/A;   SHOULDER ARTHROSCOPY W/ ROTATOR CUFF REPAIR Right    UMBILICAL HERNIA REPAIR  Family History  Problem Relation Age of Onset   Diabetes Mother    Hypertension Mother    Hypertension Father    COPD Father    COPD Brother    Coronary artery disease Neg Hx      Social History   Socioeconomic History   Marital status: Married    Spouse name: Not on file   Number of children: Not on file   Years of education: Not on file   Highest education level: Not on file  Occupational History   Occupation: Full time  Tobacco Use   Smoking status: Former    Packs/day: 0.12    Years: 2.00    Additional pack years: 0.00    Total pack years: 0.24    Types: Cigarettes   Smokeless tobacco: Never   Tobacco comments:    quit smoking in 1981  Vaping Use   Vaping Use: Never used  Substance and Sexual Activity   Alcohol use: Yes    Comment:  occasional   Drug use: Not Currently    Frequency: 3.0 times per week    Types: Marijuana    Comment: 2-3 times per week    Sexual activity: Yes  Other Topics Concern   Not on file  Social History Narrative   Married   Social Determinants of Health   Financial Resource Strain: Not on file  Food Insecurity: Not on file  Transportation Needs: Not on file  Physical Activity: Not on file  Stress: Not on file  Social Connections: Not on file  Intimate Partner Violence: Not on file     There were no vitals taken for this visit.  Physical Exam:  Well appearing NAD HEENT: Unremarkable Neck:  No JVD, no thyromegally Lymphatics:  No adenopathy Back:  No CVA tenderness Lungs:  Clear HEART:  Regular rate rhythm, no murmurs, no rubs, no clicks Abd:  soft, positive bowel sounds, no organomegally, no rebound, no guarding Ext:  2 plus pulses, no edema, no cyanosis, no clubbing Skin:  No rashes no nodules Neuro:  CN II through XII intact, motor grossly intact  DEVICE  Normal device function.  See PaceArt for details.   Assess/Plan:  Sinus node dysfunction - he is asymptomatic s/p PPM insertion. Syncope - none recently. HTN -his bp is good today. PVC's - s/p remote ablation.   [name],MD

## 2022-07-30 ENCOUNTER — Ambulatory Visit (INDEPENDENT_AMBULATORY_CARE_PROVIDER_SITE_OTHER): Payer: Medicare HMO

## 2022-07-30 DIAGNOSIS — I495 Sick sinus syndrome: Secondary | ICD-10-CM | POA: Diagnosis not present

## 2022-07-31 LAB — CUP PACEART REMOTE DEVICE CHECK
Battery Voltage: 95
Date Time Interrogation Session: 20240610072556
Implantable Lead Connection Status: 753985
Implantable Lead Connection Status: 753985
Implantable Lead Implant Date: 20130110
Implantable Lead Implant Date: 20130110
Implantable Lead Location: 753859
Implantable Lead Location: 753860
Implantable Lead Model: 350
Implantable Lead Model: 350
Implantable Pulse Generator Implant Date: 20230911
Pulse Gen Model: 407145
Pulse Gen Serial Number: 100044202

## 2022-08-14 ENCOUNTER — Ambulatory Visit (HOSPITAL_COMMUNITY): Admission: RE | Admit: 2022-08-14 | Payer: Medicare HMO | Source: Ambulatory Visit

## 2022-08-20 NOTE — Progress Notes (Signed)
Remote pacemaker transmission.   

## 2022-08-30 ENCOUNTER — Ambulatory Visit: Payer: Medicare HMO | Admitting: Cardiology

## 2022-09-18 ENCOUNTER — Ambulatory Visit (HOSPITAL_COMMUNITY)
Admission: RE | Admit: 2022-09-18 | Discharge: 2022-09-18 | Disposition: A | Discharge status: Home / Self Care | Payer: Medicare HMO | Source: Ambulatory Visit | Attending: Pulmonary Disease | Admitting: Pulmonary Disease

## 2022-09-18 DIAGNOSIS — R053 Chronic cough: Secondary | ICD-10-CM | POA: Diagnosis present

## 2022-09-18 LAB — PULMONARY FUNCTION TEST
DL/VA % pred: 86 %
DL/VA: 3.65 ml/min/mmHg/L
DLCO unc % pred: 107 %
DLCO unc: 28.54 ml/min/mmHg
FEF 25-75 Post: 4.08 L/sec
FEF 25-75 Pre: 3.99 L/sec
FEF2575-%Change-Post: 2 %
FEF2575-%Pred-Post: 141 %
FEF2575-%Pred-Pre: 138 %
FEV1-%Change-Post: 1 %
FEV1-%Pred-Post: 126 %
FEV1-%Pred-Pre: 124 %
FEV1-Post: 4.41 L
FEV1-Pre: 4.34 L
FEV1FVC-%Change-Post: 0 %
FEV1FVC-%Pred-Pre: 103 %
FEV6-%Change-Post: 1 %
FEV6-%Pred-Post: 127 %
FEV6-%Pred-Pre: 125 %
FEV6-Post: 5.61 L
FEV6-Pre: 5.5 L
FEV6FVC-%Change-Post: 0 %
FEV6FVC-%Pred-Post: 104 %
FEV6FVC-%Pred-Pre: 104 %
FVC-%Change-Post: 1 %
FVC-%Pred-Post: 122 %
FVC-%Pred-Pre: 120 %
FVC-Post: 5.64 L
FVC-Pre: 5.55 L
Post FEV1/FVC ratio: 78 %
Post FEV6/FVC ratio: 99 %
Pre FEV1/FVC ratio: 78 %
Pre FEV6/FVC Ratio: 99 %
RV % pred: 140 %
RV: 3.07 L
TLC % pred: 130 %
TLC: 8.84 L

## 2022-09-18 MED ORDER — ALBUTEROL SULFATE (2.5 MG/3ML) 0.083% IN NEBU
2.5000 mg | INHALATION_SOLUTION | Freq: Once | RESPIRATORY_TRACT | Status: AC
  Administered 2022-09-18: 2.5 mg via RESPIRATORY_TRACT

## 2022-09-21 ENCOUNTER — Telehealth: Payer: Self-pay | Admitting: Pulmonary Disease

## 2022-09-25 NOTE — Telephone Encounter (Signed)
Please let him know his PFT is normal.

## 2022-09-25 NOTE — Telephone Encounter (Signed)
Left message advising patient of normal PFT.

## 2022-10-29 ENCOUNTER — Ambulatory Visit (INDEPENDENT_AMBULATORY_CARE_PROVIDER_SITE_OTHER): Payer: Medicare HMO

## 2022-10-29 DIAGNOSIS — I495 Sick sinus syndrome: Secondary | ICD-10-CM | POA: Diagnosis not present

## 2022-10-29 LAB — CUP PACEART REMOTE DEVICE CHECK
Date Time Interrogation Session: 20240909111450
Implantable Lead Connection Status: 753985
Implantable Lead Connection Status: 753985
Implantable Lead Implant Date: 20130110
Implantable Lead Implant Date: 20130110
Implantable Lead Location: 753859
Implantable Lead Location: 753860
Implantable Lead Model: 350
Implantable Lead Model: 350
Implantable Pulse Generator Implant Date: 20230911
Pulse Gen Model: 407145
Pulse Gen Serial Number: 100044202

## 2022-11-08 NOTE — Progress Notes (Signed)
Remote pacemaker transmission.   

## 2022-12-13 ENCOUNTER — Encounter: Payer: Self-pay | Admitting: Cardiology

## 2022-12-13 ENCOUNTER — Ambulatory Visit: Payer: Medicare HMO | Attending: Cardiology | Admitting: Cardiology

## 2022-12-13 VITALS — BP 122/82 | HR 74 | Ht 69.0 in | Wt 225.0 lb

## 2022-12-13 DIAGNOSIS — E782 Mixed hyperlipidemia: Secondary | ICD-10-CM | POA: Diagnosis not present

## 2022-12-13 DIAGNOSIS — R55 Syncope and collapse: Secondary | ICD-10-CM | POA: Diagnosis not present

## 2022-12-13 DIAGNOSIS — I471 Supraventricular tachycardia, unspecified: Secondary | ICD-10-CM | POA: Diagnosis not present

## 2022-12-13 MED ORDER — MEDICAL COMPRESSION STOCKINGS MISC: 1.0000 [IU] | Freq: Once | 0 refills | Status: AC

## 2022-12-13 MED ORDER — FLUDROCORTISONE ACETATE 0.1 MG PO TABS: 0.1000 mg | ORAL_TABLET | Freq: Every day | ORAL | 11 refills | Status: DC | PRN

## 2022-12-13 NOTE — Patient Instructions (Signed)
Medication Instructions:  Your physician recommends that you continue on your current medications as directed. Please refer to the Current Medication list given to you today.  *If you need a refill on your cardiac medications before your next appointment, please call your pharmacy*   Lab Work: Fasting Lipid Panel  If you have labs (blood work) drawn today and your tests are completely normal, you will receive your results only by: MyChart Message (if you have MyChart) OR A paper copy in the mail If you have any lab test that is abnormal or we need to change your treatment, we will call you to review the results.   Testing/Procedures: None   Follow-Up: At [facility], you and your health needs are our priority.  As part of our continuing mission to provide you with exceptional heart care, we have created designated Provider Care Teams.  These Care Teams include your primary Cardiologist (physician) and Advanced Practice Providers (APPs -  Physician Assistants and Nurse Practitioners) who all work together to provide you with the care you need, when you need it.  We recommend signing up for the patient portal called "MyChart".  Sign up information is provided on this After Visit Summary.  MyChart is used to connect with patients for Virtual Visits (Telemedicine).  Patients are able to view lab/test results, encounter notes, upcoming appointments, etc.  Non-urgent messages can be sent to your provider as well.   To learn more about what you can do with MyChart, go to [email or web address].    Your next appointment:   6 month(s)  Provider:   You may see Dina Rich, MD or one of the following Advanced Practice Providers on your designated Care Team:   Randall An, PA-C  Jacolyn Reedy, [place]     Other Instructions

## 2022-12-13 NOTE — Progress Notes (Signed)
Clinical Summary Mr. Loeffel is a 62 y.o.male seen today for follow up of the following medical problems.   1. Neurocardiogenic Syncope - ongoing several years, roughly 10 years - previusly followed at [facility] - from there notes history of neurocardiogenic syncope, had pacemaker placed for this reason.    - some dizziness with standing, occurs daily. In general mild and tolerable - no syncope - working to stay well hydrated.    2. Chronic chest pain - 08/2018 nuclear stress: no ischemia  - notes mention chronic chest pain nearly 10 years. Multiple stress tests negative, prior cath with normal coroarnies.      3. Pacemaker - placd 2013 at [facility] - dual chamber Biotronik  10/2022 normal device function   4. PSVT - [facility] notes mention prior ablation in 2013 - rare palpitations.        Past Medical History:  Diagnosis Date   Anginal pain (HCC)    "EVERY SINGLE DAY" (05/04/2015)   Arthritis    "fingers; ankles" (05/04/2015)   COPD (chronic obstructive pulmonary disease) (HCC)    Daily headache    "hopefully this neck OR will help" (05/04/2015)   Depression    GERD (gastroesophageal reflux disease)    History of gout    History of hiatal hernia    History of shingles    Hypertension    has never been on meds   Neck pain    buldging disc   Presence of permanent cardiac pacemaker    Sick sinus syndrome (HCC)    Vasovagal syncope    last one Jan 2017     Allergies  Allergen Reactions   No Known Allergies      Current Outpatient Medications  Medication Sig Dispense Refill   Acetaminophen Extra Strength 500 MG TABS Take 2 tablets by mouth every 6 (six) hours as needed.     aspirin EC 81 MG tablet Take 81 mg by mouth daily. Swallow whole.     fludrocortisone (FLORINEF) 0.1 MG tablet Take 1 tablet (0.1 mg total) by mouth daily as needed (to build up blood). 30 tablet 11   ibuprofen (ADVIL) 800 MG tablet Take 800 mg by mouth daily.     metoprolol  tartrate (LOPRESSOR) 25 MG tablet Take 25 mg by mouth 2 (two) times daily.     nitroGLYCERIN (NITROSTAT) 0.4 MG SL tablet Place 1 tablet (0.4 mg total) under the tongue every 5 (five) minutes x 3 doses as needed for chest pain (if no relief after 2nd dose, proceed to the ED or call 911). 25 tablet 2   rosuvastatin (CRESTOR) 10 MG tablet Take 1 tablet (10 mg total) by mouth daily. 90 tablet 3   No current facility-administered medications for this visit.     Past Surgical History:  Procedure Laterality Date   ANTERIOR CERVICAL DECOMP/DISCECTOMY FUSION  05/04/2015   ANTERIOR CERVICAL DECOMP/DISCECTOMY FUSION N/A 05/04/2015   Procedure: C6-7 Anterior Cervical Discectomy and Fusion, Allograft, Plate ;  Surgeon: Eldred Manges, MD;  Location: [facility] OR;  Service: Orthopedics;  Laterality: N/A;   BACK SURGERY     CARDIAC CATHETERIZATION     2014   CARDIAC ELECTROPHYSIOLOGY MAPPING AND ABLATION     CARDIAC ELECTROPHYSIOLOGY STUDY & DFT     HERNIA REPAIR     LOOP RECORDER INSERTION  07/1999   [name] 07/05/2010   LOOP RECORDER REMOVAL  07/2001   [name] 07/05/2010   PACEMAKER IMPLANT  03/01/2011  Biotronik Evia implanted by Dr Luella Cook at [facility] for sick sinus syndrome   POSTERIOR CERVICAL FUSION/FORAMINOTOMY N/A 04/20/2016   Procedure: c6-7 posterior fusion, ILIAC BONE MARROW ASPIRATE, WIRING;  Surgeon: Eldred Manges, MD;  Location[facility] OR;  Service: Orthopedics;  Laterality: N/A;   PPM GENERATOR CHANGEOUT N/A 10/30/2021   Procedure: PPM GENERATOR CHANGEOUT;  Surgeon: Marinus Maw, MD;  Location: [facility] INVASIVE CV LAB;  Service: Cardiovascular;  Laterality: N/A;   SHOULDER ARTHROSCOPY W/ ROTATOR CUFF REPAIR Right    UMBILICAL HERNIA REPAIR       Allergies  Allergen Reactions   No Known Allergies       Family History  Problem Relation Age of Onset   Diabetes Mother    Hypertension Mother    Hypertension Father    COPD Father    COPD Brother    Coronary artery disease Neg Hx      Social  History Mr. Marquez reports that he has quit smoking. His smoking use included cigarettes. He has a 0.2 pack-year smoking history. He has never used smokeless tobacco. Mr. Janosik reports current alcohol use.   Review of Systems CONSTITUTIONAL: No weight loss, fever, chills, weakness or fatigue.  HEENT: Eyes: No visual loss, blurred vision, double vision or yellow sclerae.No hearing loss, sneezing, congestion, runny nose or sore throat.  SKIN: No rash or itching.  CARDIOVASCULAR: per hpi RESPIRATORY: No shortness of breath, cough or sputum.  GASTROINTESTINAL: No anorexia, nausea, vomiting or diarrhea. No abdominal pain or blood.  GENITOURINARY: No burning on urination, no polyuria NEUROLOGICAL: No headache, dizziness, syncope, paralysis, ataxia, numbness or tingling in the extremities. No change in bowel or bladder control.  MUSCULOSKELETAL: No muscle, back pain, joint pain or stiffness.  LYMPHATICS: No enlarged nodes. No history of splenectomy.  PSYCHIATRIC: No history of depression or anxiety.  ENDOCRINOLOGIC: No reports of sweating, cold or heat intolerance. No polyuria or polydipsia.  Marland Kitchen   Physical Examination Vitals:   12/13/22 1525  BP: 122/82  Pulse: 74  SpO2: 97%   Filed Weights   12/13/22 1525  Weight: 225 lb (102.1 kg)    Gen: resting comfortably, no acute distress HEENT: no scleral icterus, pupils equal round and reactive, no palptable cervical adenopathy,  CV: RRR, no m/rg, no jvd Resp: Clear to auscultation bilaterally GI: abdomen is soft, non-tender, non-distended, normal bowel sounds, no hepatosplenomegaly MSK: extremities are warm, no edema.  Skin: warm, no rash Neuro:  no focal deficits Psych: appropriate affect   Diagnostic Studies  08/2018 nuclear study Gated stress images:  Ejection fraction: >70%.  Wall motion abnormalities: None.   CONCLUSION:   1.  No inducible ischemia.  2.  Normal left ventricular function (EF >70%).       05/2012 cath    OTHER:  Widely patent coronaries.  Normal left main.  Normal left ventricular function.       08/2011 [facility] EP ablation Summary:   1. NSR at baseline.  2. Spontaneous PVC of LBBB morphology, inferior and predominately rightward  (isoelectric in I, negative in aVL) directed axis identified. Mapping in the  LVOT at the level of the AV was later than surface activation. RVOT  mapping confirmed PVC's originated from area 3 of the RVOT and was  pre-QRS. RF application here induced automaticity with a 12/12 match to the  clinical PVC. The clinical PVC was not seen after the first lesion despite  aggressive eipnephrine challenge.  3. No overt complications  TTE 2005 SUMMARY -  Overall left ventricular systolic function was normal. Left ventricular ejection fraction was estimated , range being 55 to 65 %.. This study was inadequate for the evaluation of left ventricular regional wall motion. -  Extremely limited due to poor sound wave transmission; LV function appears to be preserved; focal wall motion abnormality cannot be excluded; suggest TEE or MUGA if       clinically indicated.  NMS test: NOVANT 2001 IMPRESSION NEGATIVE FOR ISCHEMIA. LEFT VENTRICULAR EJECTION FRACTION 58%   Assessment and Plan   1. Neurocardiogenic syncope - no recent syncope - some ongoing orthostatic dizziness - orthostatics abnormal today, DBP drops 17 points sitting to standing - encouraged increased hydration, start to wear compression stockings again. He report symptoms not bad enough where he wants to increase florinef  2.PSVT - limited palpitations, monitor at this time  3. HLD - update lipid panel  F/u 6 months     Antoine Poche, M.D.

## 2023-01-28 ENCOUNTER — Ambulatory Visit (INDEPENDENT_AMBULATORY_CARE_PROVIDER_SITE_OTHER): Payer: Medicare HMO

## 2023-01-28 DIAGNOSIS — I495 Sick sinus syndrome: Secondary | ICD-10-CM

## 2023-01-28 LAB — CUP PACEART REMOTE DEVICE CHECK
Date Time Interrogation Session: 20241209092139
Implantable Lead Connection Status: 753985
Implantable Lead Connection Status: 753985
Implantable Lead Implant Date: 20130110
Implantable Lead Implant Date: 20130110
Implantable Lead Location: 753859
Implantable Lead Location: 753860
Implantable Lead Model: 350
Implantable Lead Model: 350
Implantable Pulse Generator Implant Date: 20230911
Pulse Gen Model: 407145
Pulse Gen Serial Number: 100044202

## 2023-04-29 ENCOUNTER — Ambulatory Visit (INDEPENDENT_AMBULATORY_CARE_PROVIDER_SITE_OTHER): Payer: Self-pay

## 2023-04-29 DIAGNOSIS — I495 Sick sinus syndrome: Secondary | ICD-10-CM

## 2023-04-30 ENCOUNTER — Encounter: Payer: Self-pay | Admitting: Internal Medicine

## 2023-04-30 LAB — CUP PACEART REMOTE DEVICE CHECK
Battery Voltage: 90
Date Time Interrogation Session: 20250310114243
Implantable Lead Connection Status: 753985
Implantable Lead Connection Status: 753985
Implantable Lead Implant Date: 20130110
Implantable Lead Implant Date: 20130110
Implantable Lead Location: 753859
Implantable Lead Location: 753860
Implantable Lead Model: 350
Implantable Lead Model: 350
Implantable Pulse Generator Implant Date: 20230911
Pulse Gen Model: 407145
Pulse Gen Serial Number: 100044202

## 2023-05-29 NOTE — Progress Notes (Signed)
 Remote pacemaker transmission.

## 2023-05-29 NOTE — Addendum Note (Signed)
 Addended by: Geralyn Flash D on: 05/29/2023 03:33 PM   Modules accepted: Orders

## 2023-07-26 LAB — CUP PACEART REMOTE DEVICE CHECK
Date Time Interrogation Session: 20250606084824
Implantable Lead Connection Status: 753985
Implantable Lead Connection Status: 753985
Implantable Lead Implant Date: 20130110
Implantable Lead Implant Date: 20130110
Implantable Lead Location: 753859
Implantable Lead Location: 753860
Implantable Lead Model: 350
Implantable Lead Model: 350
Implantable Pulse Generator Implant Date: 20230911
Pulse Gen Model: 407145
Pulse Gen Serial Number: 100044202

## 2023-07-28 ENCOUNTER — Ambulatory Visit: Payer: Self-pay | Admitting: Internal Medicine

## 2023-07-29 ENCOUNTER — Ambulatory Visit (INDEPENDENT_AMBULATORY_CARE_PROVIDER_SITE_OTHER): Payer: Self-pay

## 2023-07-29 DIAGNOSIS — I495 Sick sinus syndrome: Secondary | ICD-10-CM | POA: Diagnosis not present

## 2023-09-10 NOTE — Progress Notes (Signed)
 Remote pacemaker transmission.

## 2023-10-28 ENCOUNTER — Ambulatory Visit (INDEPENDENT_AMBULATORY_CARE_PROVIDER_SITE_OTHER): Payer: Self-pay

## 2023-10-28 DIAGNOSIS — I495 Sick sinus syndrome: Secondary | ICD-10-CM

## 2023-10-29 LAB — CUP PACEART REMOTE DEVICE CHECK
Battery Voltage: 85
Date Time Interrogation Session: 20250908112002
Implantable Lead Connection Status: 753985
Implantable Lead Connection Status: 753985
Implantable Lead Implant Date: 20130110
Implantable Lead Implant Date: 20130110
Implantable Lead Location: 753859
Implantable Lead Location: 753860
Implantable Lead Model: 350
Implantable Lead Model: 350
Implantable Pulse Generator Implant Date: 20230911
Pulse Gen Model: 407145
Pulse Gen Serial Number: 100044202

## 2023-11-05 ENCOUNTER — Ambulatory Visit: Payer: Self-pay | Admitting: Internal Medicine

## 2023-11-07 NOTE — Progress Notes (Signed)
 Remote PPM Transmission

## 2023-12-27 ENCOUNTER — Other Ambulatory Visit: Payer: Self-pay | Admitting: Cardiology

## 2023-12-30 ENCOUNTER — Other Ambulatory Visit: Payer: Self-pay | Admitting: Cardiology

## 2023-12-30 MED ORDER — ROSUVASTATIN CALCIUM 10 MG PO TABS: 10.0000 mg | ORAL_TABLET | Freq: Every day | ORAL | 0 refills | Status: DC

## 2023-12-30 NOTE — Telephone Encounter (Signed)
 Pt of Dr. Alvan. Does Dr. Alvan want to refill this RX? Please advise.

## 2024-01-14 ENCOUNTER — Ambulatory Visit: Attending: Student | Admitting: Student

## 2024-01-14 ENCOUNTER — Encounter: Payer: Self-pay | Admitting: Student

## 2024-01-14 VITALS — BP 102/60 | HR 68 | Ht 69.0 in | Wt 216.0 lb

## 2024-01-14 DIAGNOSIS — I471 Supraventricular tachycardia, unspecified: Secondary | ICD-10-CM

## 2024-01-14 DIAGNOSIS — E782 Mixed hyperlipidemia: Secondary | ICD-10-CM

## 2024-01-14 DIAGNOSIS — Z79899 Other long term (current) drug therapy: Secondary | ICD-10-CM

## 2024-01-14 DIAGNOSIS — I951 Orthostatic hypotension: Secondary | ICD-10-CM

## 2024-01-14 DIAGNOSIS — I495 Sick sinus syndrome: Secondary | ICD-10-CM

## 2024-01-14 DIAGNOSIS — Z131 Encounter for screening for diabetes mellitus: Secondary | ICD-10-CM | POA: Diagnosis not present

## 2024-01-14 MED ORDER — FLUDROCORTISONE ACETATE 0.1 MG PO TABS: 0.1000 mg | ORAL_TABLET | Freq: Every day | ORAL | 3 refills | Status: AC

## 2024-01-14 MED ORDER — ROSUVASTATIN CALCIUM 10 MG PO TABS: 10.0000 mg | ORAL_TABLET | Freq: Every day | ORAL | 3 refills | Status: AC

## 2024-01-14 MED ORDER — METOPROLOL TARTRATE 25 MG PO TABS
25.0000 mg | ORAL_TABLET | Freq: Two times a day (BID) | ORAL | 3 refills | Status: AC
Start: 2024-01-14 — End: ?

## 2024-01-14 NOTE — Progress Notes (Signed)
 Cardiology Office Note    Date:  01/14/2024  ID:  [name] [name], DOB 27-May-1960, MRN [id number] Cardiologist: Alvan Carrier, MD Electrophysiologist:  Danelle Birmingham, MD { :  History of Present Illness:    [name] [name] is a 63 y.o. male with past medical history of neurocardiogenic syncope (s/p Biotronik dual-chamber PPM, gen change in 10/2021), SVT, chronic chest wall pain (prior catheterization showing normal coronary arteries and no ischemia by NST in 08/2018), orthostatic hypotension, HLD and GERD who presents to the office today for annual follow-up.  He was examined by Dr. Alvan in 11/2022 and reported still having occasional dizziness with standing and was trying to stay hydrated. Orthostatics were checked in clinic and diastolic blood pressure dropped by 17 points. He was encouraged to increase hydration and to start wearing compression stockings. He did not wish to increase Florinef  at that time and was continued on his current medical therapy with ASA 81 mg daily, Florinef  0.1 mg daily, Lopressor  25 mg twice daily and Crestor  10 mg daily.  In talking with the patient and his wife today, he reports overall doing well from a cardiac perspective since his last office visit. He reports he was previously having more frequent episodes of dizziness but these have improved over the past several months.  He has been trying to increase his fluid intake. Takes Florinef  on a daily basis. Says that his episodes of dizziness typically occurred while going to the restroom or when changing positions too quickly. He has recently been hunting which has led to more physical activity and he denies any chest pain or dyspnea on exertion with this. No recent palpitations, orthopnea, PND or pitting edema.  Studies Reviewed:   EKG: EKG is ordered today and demonstrates:   EKG Interpretation Date/Time:  Tuesday January 14 2024 15:02:47 EST Ventricular Rate:  71 PR Interval:  284 QRS Duration:  112 QT  Interval:  402 QTC Calculation: 436 R Axis:   -42  Text Interpretation: Atrial-paced rhythm with prolonged AV conduction Left axis deviation Confirmed by Johnson Grate ([id number]) on 01/14/2024 3:19:28 PM       NST: 08/2018 TECHNIQUE: A Lexiscan stress protocol was used. 10.84 mCi of Tc-47m Tetrofosmin was administered intravenously at rest and 30.4 mCi was administered intravenously at stress. Gated SPECT images were obtained and processed.   LIMITATIONS: None.   FINDINGS:  Unexpected: None.  Defect: None.  TID: <1.25    Gated stress images:  Ejection fraction: >70%.  Wall motion abnormalities: None.   IMPRESSION:   1. No inducible ischemia.  2.  Normal left ventricular function (EF >70%).    Physical Exam:   VS:  BP 102/60 (BP Location: Left Arm, Cuff Size: Large)   Pulse 68   Ht 5' 9 (1.753 m)   Wt 216 lb (98 kg)   SpO2 95%   BMI 31.90 kg/m    Wt Readings from Last 3 Encounters:  01/14/24 216 lb (98 kg)  12/13/22 225 lb (102.1 kg)  06/27/22 219 lb (99.3 kg)     GEN: Well nourished, well developed male appearing in no acute distress NECK: No JVD; No carotid bruits CARDIAC: RRR, no murmurs, rubs, gallops RESPIRATORY:  Clear to auscultation without rales, wheezing or rhonchi  ABDOMEN: Appears non-distended. No obvious abdominal masses. EXTREMITIES: No clubbing or cyanosis. No pitting edema.  Distal pedal pulses are 2+ bilaterally.   Assessment and Plan:   1. Sinus node dysfunction (HCC) - He does have a Biotronik PPM  in place and recent device interrogation in 10/2023 showed this was functioning normally. He is overdue for follow-up with EP and will arrange for a visit with Dr. Almetta in early 2026.  2. Hyperlipidemia, mixed - Will recheck an FLP and LFT's. Continue current medical therapy with Crestor  10 mg daily.  3. Screening for Type 2 DM - He has not had routine labs in several years. Will recheck a Hgb A1c with upcoming labs.   4. Orthostatic  hypotension - Symptoms have overall been well-controlled. We reviewed the importance of adequate hydration and changing positions slowly. Continue current medical therapy with Florinef  0.1 mg daily.  5. SVT - Notes from Care Everywhere mention showed that he underwent ablation in 2013. Symptoms have overall been well-controlled. Continue current medical therapy with Lopressor  25 mg twice daily.  Signed, Laymon CHRISTELLA Qua, PA-C

## 2024-01-14 NOTE — Patient Instructions (Signed)
 Medication Instructions:  Your physician recommends that you continue on your current medications as directed. Please refer to the Current Medication list given to you today.  *If you need a refill on your cardiac medications before your next appointment, please call your pharmacy*  Lab Work: Your physician recommends that you return for lab work in: Fasting   If you have labs (blood work) drawn today and your tests are completely normal, you will receive your results only by: MyChart Message (if you have MyChart) OR A paper copy in the mail If you have any lab test that is abnormal or we need to change your treatment, we will call you to review the results.  Testing/Procedures: NONE    Follow-Up: At [facility], you and your health needs are our priority.  As part of our continuing mission to provide you with exceptional heart care, our providers are all part of one team.  This team includes your primary Cardiologist (physician) and Advanced Practice Providers or APPs (Physician Assistants and Nurse Practitioners) who all work together to provide you with the care you need, when you need it.  Your next appointment:   1 year(s)  Provider:   You may see Alvan Carrier, MD or one of the following Advanced Practice Providers on your designated Care Team:   Laymon Qua, PA-C  [place], [place], [place]     We recommend signing up for the patient portal called MyChart.  Sign up information is provided on this After Visit Summary.  MyChart is used to connect with patients for Virtual Visits (Telemedicine).  Patients are able to view lab/test results, encounter notes, upcoming appointments, etc.  Non-urgent messages can be sent to your provider as well.   To learn more about what you can do with MyChart, go to [email or web address].   Other Instructions Thank you for choosing [facility]!

## 2024-01-27 ENCOUNTER — Ambulatory Visit: Payer: Self-pay

## 2024-01-28 LAB — CUP PACEART REMOTE DEVICE CHECK
Battery Voltage: 80
Date Time Interrogation Session: 20251208090520
Implantable Lead Connection Status: 753985
Implantable Lead Connection Status: 753985
Implantable Lead Implant Date: 20130110
Implantable Lead Implant Date: 20130110
Implantable Lead Location: 753859
Implantable Lead Location: 753860
Implantable Lead Model: 350
Implantable Lead Model: 350
Implantable Pulse Generator Implant Date: 20230911
Pulse Gen Model: 407145
Pulse Gen Serial Number: 100044202

## 2024-01-29 ENCOUNTER — Ambulatory Visit: Payer: Self-pay | Admitting: Internal Medicine

## 2024-02-04 NOTE — Progress Notes (Signed)
 Remote PPM Transmission

## 2024-02-27 ENCOUNTER — Other Ambulatory Visit (HOSPITAL_COMMUNITY): Payer: Self-pay | Admitting: Orthopedic Surgery

## 2024-02-27 DIAGNOSIS — M25561 Pain in right knee: Secondary | ICD-10-CM

## 2024-03-20 ENCOUNTER — Ambulatory Visit (HOSPITAL_COMMUNITY)
Admission: RE | Admit: 2024-03-20 | Discharge: 2024-03-20 | Disposition: A | Source: Ambulatory Visit | Attending: Orthopedic Surgery

## 2024-03-20 DIAGNOSIS — M25561 Pain in right knee: Secondary | ICD-10-CM | POA: Diagnosis present

## 2024-03-20 MED ORDER — IOHEXOL 180 MG/ML  SOLN
10.0000 mL | Freq: Once | INTRAMUSCULAR | Status: AC | PRN
  Administered 2024-03-20: 10 mL via INTRA_ARTICULAR

## 2024-03-20 MED ORDER — LIDOCAINE HCL (PF) 1 % IJ SOLN
INTRAMUSCULAR | Status: AC
  Filled 2024-03-20: qty 5

## 2024-03-20 MED ORDER — SODIUM CHLORIDE (PF) 0.9% IJ SOLUTION - NO CHARGE
50.0000 mL | INTRAMUSCULAR | Status: DC | PRN
  Administered 2024-03-20: 30 mL via INTRAVENOUS

## 2024-03-20 MED ORDER — LIDOCAINE HCL (PF) 1 % IJ SOLN
5.0000 mL | Freq: Once | INTRAMUSCULAR | Status: AC
  Administered 2024-03-20: 5 mL via INTRADERMAL

## 2024-03-20 MED ORDER — IOHEXOL 180 MG/ML  SOLN
10.0000 mL | Freq: Once | INTRAMUSCULAR | Status: AC | PRN
  Administered 2024-03-20: 4 mL via INTRA_ARTICULAR

## 2024-04-27 ENCOUNTER — Ambulatory Visit: Payer: Self-pay

## 2024-07-27 ENCOUNTER — Ambulatory Visit: Payer: Self-pay

## 2024-10-26 ENCOUNTER — Ambulatory Visit

## 2025-01-25 ENCOUNTER — Ambulatory Visit

## 2025-04-26 ENCOUNTER — Ambulatory Visit
